# Patient Record
Sex: Female | Born: 1990
Health system: Southern US, Community
[De-identification: ages and names within clinical notes are randomized; demographics above are authoritative.]

## PROBLEM LIST (undated history)

## (undated) DIAGNOSIS — Z8619 Personal history of other infectious and parasitic diseases: Secondary | ICD-10-CM

## (undated) DIAGNOSIS — R87629 Unspecified abnormal cytological findings in specimens from vagina: Secondary | ICD-10-CM

## (undated) DIAGNOSIS — Z872 Personal history of diseases of the skin and subcutaneous tissue: Secondary | ICD-10-CM

## (undated) HISTORY — DX: Personal history of other infectious and parasitic diseases: Z86.19

## (undated) HISTORY — DX: Unspecified abnormal cytological findings in specimens from vagina: R87.629

## (undated) HISTORY — DX: Personal history of diseases of the skin and subcutaneous tissue: Z87.2

## (undated) HISTORY — PX: DILATION AND CURETTAGE OF UTERUS: SHX78

---
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
## 2010-11-24 ENCOUNTER — Other Ambulatory Visit: Payer: Self-pay | Admitting: Family Medicine

## 2010-11-24 ENCOUNTER — Other Ambulatory Visit (HOSPITAL_COMMUNITY)
Admission: RE | Admit: 2010-11-24 | Discharge: 2010-11-24 | Disposition: A | Discharge status: Home / Self Care | Payer: 59 | Source: Ambulatory Visit | Attending: Family Medicine | Admitting: Family Medicine

## 2010-11-24 DIAGNOSIS — Z124 Encounter for screening for malignant neoplasm of cervix: Secondary | ICD-10-CM | POA: Insufficient documentation

## 2011-12-01 ENCOUNTER — Other Ambulatory Visit: Payer: Self-pay

## 2011-12-20 ENCOUNTER — Other Ambulatory Visit (HOSPITAL_COMMUNITY)
Admission: RE | Admit: 2011-12-20 | Discharge: 2011-12-20 | Disposition: A | Discharge status: Home / Self Care | Payer: 59 | Source: Ambulatory Visit | Attending: Family Medicine | Admitting: Family Medicine

## 2011-12-20 ENCOUNTER — Other Ambulatory Visit: Payer: Self-pay | Admitting: Family Medicine

## 2011-12-20 DIAGNOSIS — Z124 Encounter for screening for malignant neoplasm of cervix: Secondary | ICD-10-CM | POA: Insufficient documentation

## 2013-05-16 ENCOUNTER — Other Ambulatory Visit: Payer: Self-pay | Admitting: Family Medicine

## 2013-05-16 ENCOUNTER — Other Ambulatory Visit (HOSPITAL_COMMUNITY)
Admission: RE | Admit: 2013-05-16 | Discharge: 2013-05-16 | Disposition: A | Discharge status: Home / Self Care | Payer: 59 | Source: Ambulatory Visit | Attending: Family Medicine | Admitting: Family Medicine

## 2013-05-16 DIAGNOSIS — Z124 Encounter for screening for malignant neoplasm of cervix: Secondary | ICD-10-CM | POA: Insufficient documentation

## 2013-05-16 DIAGNOSIS — Z1151 Encounter for screening for human papillomavirus (HPV): Secondary | ICD-10-CM | POA: Insufficient documentation

## 2013-05-16 DIAGNOSIS — R87810 Cervical high risk human papillomavirus (HPV) DNA test positive: Secondary | ICD-10-CM | POA: Insufficient documentation

## 2013-07-11 ENCOUNTER — Other Ambulatory Visit: Payer: Self-pay | Admitting: Obstetrics and Gynecology

## 2014-12-09 ENCOUNTER — Other Ambulatory Visit: Payer: Self-pay | Admitting: Obstetrics and Gynecology

## 2014-12-10 LAB — CYTOLOGY - PAP

## 2016-03-03 ENCOUNTER — Other Ambulatory Visit: Payer: Self-pay | Admitting: Obstetrics and Gynecology

## 2016-03-03 DIAGNOSIS — Z3490 Encounter for supervision of normal pregnancy, unspecified, unspecified trimester: Secondary | ICD-10-CM | POA: Insufficient documentation

## 2016-03-03 LAB — OB RESULTS CONSOLE HIV ANTIBODY (ROUTINE TESTING): HIV: NONREACTIVE

## 2016-03-03 LAB — OB RESULTS CONSOLE ABO/RH: RH Type: POSITIVE

## 2016-03-03 LAB — OB RESULTS CONSOLE GC/CHLAMYDIA
CHLAMYDIA, DNA PROBE: NEGATIVE
Gonorrhea: NEGATIVE

## 2016-03-03 LAB — OB RESULTS CONSOLE ANTIBODY SCREEN: Antibody Screen: NEGATIVE

## 2016-03-03 LAB — OB RESULTS CONSOLE HEPATITIS B SURFACE ANTIGEN: HEP B S AG: NEGATIVE

## 2016-03-03 LAB — OB RESULTS CONSOLE RUBELLA ANTIBODY, IGM: RUBELLA: IMMUNE

## 2016-03-03 LAB — OB RESULTS CONSOLE RPR: RPR: NONREACTIVE

## 2016-03-04 LAB — CYTOLOGY - PAP

## 2016-05-10 NOTE — L&D Delivery Note (Signed)
Delivery Note Patient pushed for about an hour after she was noted to be C/C/+2  At 2:57 PM a viable and healthy female was delivered via Vaginal, Spontaneous Delivery (Presentation:OA ).  APGAR: 8, 9; weight pending.  Shoulders and body easily delivered.  Baby placed on maternal abdomen. He was crying vigorously and moving all extremities. Cord double clamped and cut by father.  Cord blood obtained. Placenta spontaneously delivered intact 3 vessels noted.  No complications.    Anesthesia:  Epidural and local lidocaine Episiotomy: None Lacerations: 2nd degree;Vaginal Suture Repair: 2.0 vicryl Est. Blood Loss (mL):  350 mL  Mom to postpartum.  Baby to Couplet care / Skin to Skin.  Essie HartINN, Terie Lear STACIA 10/20/2016, 3:17 PM

## 2016-09-22 ENCOUNTER — Other Ambulatory Visit: Payer: Self-pay | Admitting: Obstetrics and Gynecology

## 2016-09-22 LAB — OB RESULTS CONSOLE GBS: GBS: POSITIVE

## 2016-10-18 ENCOUNTER — Encounter (HOSPITAL_COMMUNITY): Payer: Self-pay | Admitting: *Deleted

## 2016-10-18 ENCOUNTER — Telehealth (HOSPITAL_COMMUNITY): Payer: Self-pay | Admitting: *Deleted

## 2016-10-18 NOTE — Telephone Encounter (Signed)
Preadmission screen  

## 2016-10-19 ENCOUNTER — Inpatient Hospital Stay (HOSPITAL_COMMUNITY)
Admission: AD | Admit: 2016-10-19 | Discharge: 2016-10-22 | DRG: 775 | Disposition: A | Discharge status: Home / Self Care | Payer: 59 | Source: Ambulatory Visit | Attending: Obstetrics & Gynecology | Admitting: Obstetrics & Gynecology

## 2016-10-19 DIAGNOSIS — Z3A40 40 weeks gestation of pregnancy: Secondary | ICD-10-CM | POA: Diagnosis not present

## 2016-10-19 DIAGNOSIS — O480 Post-term pregnancy: Secondary | ICD-10-CM | POA: Diagnosis present

## 2016-10-19 DIAGNOSIS — O99214 Obesity complicating childbirth: Secondary | ICD-10-CM | POA: Diagnosis present

## 2016-10-19 DIAGNOSIS — O3663X0 Maternal care for excessive fetal growth, third trimester, not applicable or unspecified: Secondary | ICD-10-CM | POA: Diagnosis present

## 2016-10-19 DIAGNOSIS — Z3A41 41 weeks gestation of pregnancy: Secondary | ICD-10-CM

## 2016-10-19 DIAGNOSIS — Z6841 Body Mass Index (BMI) 40.0 and over, adult: Secondary | ICD-10-CM

## 2016-10-19 LAB — CBC
HCT: 36.2 % (ref 36.0–46.0)
Hemoglobin: 12.4 g/dL (ref 12.0–15.0)
MCH: 29.9 pg (ref 26.0–34.0)
MCHC: 34.3 g/dL (ref 30.0–36.0)
MCV: 87.2 fL (ref 78.0–100.0)
PLATELETS: 324 10*3/uL (ref 150–400)
RBC: 4.15 MIL/uL (ref 3.87–5.11)
RDW: 13.4 % (ref 11.5–15.5)
WBC: 11.3 10*3/uL — ABNORMAL HIGH (ref 4.0–10.5)

## 2016-10-19 LAB — ABO/RH: ABO/RH(D): A POS

## 2016-10-19 LAB — TYPE AND SCREEN
ABO/RH(D): A POS
Antibody Screen: NEGATIVE

## 2016-10-19 MED ORDER — DIPHENHYDRAMINE HCL 50 MG/ML IJ SOLN: 12.5000 mg | INTRAMUSCULAR | Status: DC | PRN

## 2016-10-19 MED ORDER — LACTATED RINGERS IV SOLN
500.0000 mL | INTRAVENOUS | Status: DC | PRN
  Administered 2016-10-20: 500 mL via INTRAVENOUS
  Administered 2016-10-20 (×2): 1000 mL via INTRAVENOUS

## 2016-10-19 MED ORDER — FENTANYL 2.5 MCG/ML BUPIVACAINE 1/10 % EPIDURAL INFUSION (WH - ANES)
14.0000 mL/h | INTRAMUSCULAR | Status: DC | PRN
  Administered 2016-10-20 (×2): 14 mL/h via EPIDURAL
  Filled 2016-10-19 (×2): qty 100

## 2016-10-19 MED ORDER — EPHEDRINE 5 MG/ML INJ
10.0000 mg | INTRAVENOUS | Status: DC | PRN
  Filled 2016-10-19: qty 2

## 2016-10-19 MED ORDER — LIDOCAINE HCL (PF) 1 % IJ SOLN
30.0000 mL | INTRAMUSCULAR | Status: AC | PRN
  Administered 2016-10-20: 30 mL via SUBCUTANEOUS
  Filled 2016-10-19: qty 30

## 2016-10-19 MED ORDER — TERBUTALINE SULFATE 1 MG/ML IJ SOLN
0.2500 mg | Freq: Once | INTRAMUSCULAR | Status: DC | PRN
  Filled 2016-10-19: qty 1

## 2016-10-19 MED ORDER — LACTATED RINGERS IV SOLN
500.0000 mL | Freq: Once | INTRAVENOUS | Status: AC
  Administered 2016-10-20: 500 mL via INTRAVENOUS

## 2016-10-19 MED ORDER — MISOPROSTOL 25 MCG QUARTER TABLET
25.0000 ug | ORAL_TABLET | ORAL | Status: DC | PRN
  Administered 2016-10-19: 25 ug via VAGINAL
  Filled 2016-10-19 (×2): qty 1

## 2016-10-19 MED ORDER — OXYCODONE-ACETAMINOPHEN 5-325 MG PO TABS: 2.0000 | ORAL_TABLET | ORAL | Status: DC | PRN

## 2016-10-19 MED ORDER — OXYTOCIN 40 UNITS IN LACTATED RINGERS INFUSION - SIMPLE MED
1.0000 m[IU]/min | INTRAVENOUS | Status: DC
  Administered 2016-10-19: 2 m[IU]/min via INTRAVENOUS
  Filled 2016-10-19: qty 1000

## 2016-10-19 MED ORDER — PENICILLIN G POT IN DEXTROSE 60000 UNIT/ML IV SOLN
3.0000 10*6.[IU] | INTRAVENOUS | Status: DC
  Filled 2016-10-19: qty 50

## 2016-10-19 MED ORDER — PHENYLEPHRINE 40 MCG/ML (10ML) SYRINGE FOR IV PUSH (FOR BLOOD PRESSURE SUPPORT)
80.0000 ug | PREFILLED_SYRINGE | INTRAVENOUS | Status: DC | PRN
  Filled 2016-10-19: qty 10
  Filled 2016-10-19: qty 5

## 2016-10-19 MED ORDER — PHENYLEPHRINE 40 MCG/ML (10ML) SYRINGE FOR IV PUSH (FOR BLOOD PRESSURE SUPPORT)
80.0000 ug | PREFILLED_SYRINGE | INTRAVENOUS | Status: DC | PRN
  Administered 2016-10-20: 80 ug via INTRAVENOUS
  Filled 2016-10-19: qty 5

## 2016-10-19 MED ORDER — SOD CITRATE-CITRIC ACID 500-334 MG/5ML PO SOLN
30.0000 mL | ORAL | Status: DC | PRN
  Filled 2016-10-19: qty 15

## 2016-10-19 MED ORDER — OXYTOCIN 40 UNITS IN LACTATED RINGERS INFUSION - SIMPLE MED: 2.5000 [IU]/h | INTRAVENOUS | Status: DC

## 2016-10-19 MED ORDER — FENTANYL CITRATE (PF) 100 MCG/2ML IJ SOLN: 50.0000 ug | INTRAMUSCULAR | Status: DC | PRN

## 2016-10-19 MED ORDER — OXYTOCIN BOLUS FROM INFUSION
500.0000 mL | Freq: Once | INTRAVENOUS | Status: AC
  Administered 2016-10-20: 500 mL via INTRAVENOUS

## 2016-10-19 MED ORDER — PENICILLIN G POTASSIUM 5000000 UNITS IJ SOLR
5.0000 10*6.[IU] | Freq: Once | INTRAVENOUS | Status: AC
  Administered 2016-10-19: 5 10*6.[IU] via INTRAVENOUS
  Filled 2016-10-19: qty 5

## 2016-10-19 MED ORDER — ONDANSETRON HCL 4 MG/2ML IJ SOLN: 4.0000 mg | Freq: Four times a day (QID) | INTRAMUSCULAR | Status: DC | PRN

## 2016-10-19 MED ORDER — TERBUTALINE SULFATE 1 MG/ML IJ SOLN
0.2500 mg | Freq: Once | INTRAMUSCULAR | Status: AC | PRN
  Administered 2016-10-20: 0.25 mg via SUBCUTANEOUS

## 2016-10-19 MED ORDER — PENICILLIN G POTASSIUM 5000000 UNITS IJ SOLR
5.0000 10*6.[IU] | Freq: Once | INTRAVENOUS | Status: DC
  Filled 2016-10-19: qty 5

## 2016-10-19 MED ORDER — OXYCODONE-ACETAMINOPHEN 5-325 MG PO TABS: 1.0000 | ORAL_TABLET | ORAL | Status: DC | PRN

## 2016-10-19 MED ORDER — LACTATED RINGERS IV SOLN
INTRAVENOUS | Status: DC
  Administered 2016-10-19 – 2016-10-20 (×2): via INTRAVENOUS

## 2016-10-19 MED ORDER — PENICILLIN G POT IN DEXTROSE 60000 UNIT/ML IV SOLN
3.0000 10*6.[IU] | INTRAVENOUS | Status: DC
  Administered 2016-10-20 (×2): 3 10*6.[IU] via INTRAVENOUS
  Filled 2016-10-19 (×9): qty 50

## 2016-10-19 MED ORDER — ACETAMINOPHEN 325 MG PO TABS: 650.0000 mg | ORAL_TABLET | ORAL | Status: DC | PRN

## 2016-10-20 ENCOUNTER — Inpatient Hospital Stay (HOSPITAL_COMMUNITY): Payer: 59 | Admitting: Anesthesiology

## 2016-10-20 ENCOUNTER — Encounter (HOSPITAL_COMMUNITY): Payer: Self-pay | Admitting: *Deleted

## 2016-10-20 LAB — RPR: RPR Ser Ql: NONREACTIVE

## 2016-10-20 MED ORDER — OXYCODONE-ACETAMINOPHEN 5-325 MG PO TABS: 1.0000 | ORAL_TABLET | ORAL | Status: DC | PRN

## 2016-10-20 MED ORDER — TETANUS-DIPHTH-ACELL PERTUSSIS 5-2.5-18.5 LF-MCG/0.5 IM SUSP: 0.5000 mL | Freq: Once | INTRAMUSCULAR | Status: DC

## 2016-10-20 MED ORDER — ONDANSETRON HCL 4 MG/2ML IJ SOLN: 4.0000 mg | INTRAMUSCULAR | Status: DC | PRN

## 2016-10-20 MED ORDER — ONDANSETRON HCL 4 MG PO TABS: 4.0000 mg | ORAL_TABLET | ORAL | Status: DC | PRN

## 2016-10-20 MED ORDER — PRENATAL MULTIVITAMIN CH
1.0000 | ORAL_TABLET | Freq: Every day | ORAL | Status: DC
  Administered 2016-10-21 – 2016-10-22 (×2): 1 via ORAL
  Filled 2016-10-20 (×2): qty 1

## 2016-10-20 MED ORDER — ACETAMINOPHEN 325 MG PO TABS
650.0000 mg | ORAL_TABLET | ORAL | Status: DC | PRN
Start: 2016-10-20 — End: 2016-10-22

## 2016-10-20 MED ORDER — SIMETHICONE 80 MG PO CHEW: 80.0000 mg | CHEWABLE_TABLET | ORAL | Status: DC | PRN

## 2016-10-20 MED ORDER — DIPHENHYDRAMINE HCL 25 MG PO CAPS: 25.0000 mg | ORAL_CAPSULE | Freq: Four times a day (QID) | ORAL | Status: DC | PRN

## 2016-10-20 MED ORDER — OXYTOCIN 40 UNITS IN LACTATED RINGERS INFUSION - SIMPLE MED: 2.5000 [IU]/h | INTRAVENOUS | Status: DC | PRN

## 2016-10-20 MED ORDER — LIDOCAINE HCL (PF) 1 % IJ SOLN
INTRAMUSCULAR | Status: DC | PRN
  Administered 2016-10-20: 5 mL
  Administered 2016-10-20: 4 mL
  Administered 2016-10-20: 6 mL via EPIDURAL
  Administered 2016-10-20: 5 mL

## 2016-10-20 MED ORDER — BENZOCAINE-MENTHOL 20-0.5 % EX AERO
1.0000 "application " | INHALATION_SPRAY | CUTANEOUS | Status: DC | PRN
  Administered 2016-10-20: 1 via TOPICAL
  Filled 2016-10-20: qty 56

## 2016-10-20 MED ORDER — ZOLPIDEM TARTRATE 5 MG PO TABS: 5.0000 mg | ORAL_TABLET | Freq: Every evening | ORAL | Status: DC | PRN

## 2016-10-20 MED ORDER — DIBUCAINE 1 % RE OINT: 1.0000 "application " | TOPICAL_OINTMENT | RECTAL | Status: DC | PRN

## 2016-10-20 MED ORDER — SODIUM BICARBONATE 8.4 % IV SOLN
INTRAVENOUS | Status: DC | PRN
  Administered 2016-10-20 (×3): 5 mL via EPIDURAL

## 2016-10-20 MED ORDER — SENNOSIDES-DOCUSATE SODIUM 8.6-50 MG PO TABS
2.0000 | ORAL_TABLET | ORAL | Status: DC
  Administered 2016-10-21: 2 via ORAL
  Filled 2016-10-20 (×2): qty 2

## 2016-10-20 MED ORDER — OXYCODONE-ACETAMINOPHEN 5-325 MG PO TABS: 2.0000 | ORAL_TABLET | ORAL | Status: DC | PRN

## 2016-10-20 MED ORDER — COCONUT OIL OIL
1.0000 | TOPICAL_OIL | Status: DC | PRN
Start: 2016-10-20 — End: 2016-10-22

## 2016-10-20 MED ORDER — WITCH HAZEL-GLYCERIN EX PADS: 1.0000 "application " | MEDICATED_PAD | CUTANEOUS | Status: DC | PRN

## 2016-10-20 MED ORDER — IBUPROFEN 600 MG PO TABS
600.0000 mg | ORAL_TABLET | Freq: Four times a day (QID) | ORAL | Status: DC
  Administered 2016-10-20 – 2016-10-22 (×8): 600 mg via ORAL
  Filled 2016-10-20 (×8): qty 1

## 2016-10-20 NOTE — Anesthesia Preprocedure Evaluation (Signed)
Anesthesia Evaluation  Patient identified by MRN, date of birth, ID band Patient awake    Reviewed: Allergy & Precautions, H&P , Patient's Chart, lab work & pertinent test results  Airway Mallampati: II  TM Distance: >3 FB Neck ROM: full    Dental  (+) Teeth Intact   Pulmonary    breath sounds clear to auscultation       Cardiovascular  Rhythm:regular Rate:Normal     Neuro/Psych    GI/Hepatic   Endo/Other  Morbid obesity  Renal/GU      Musculoskeletal   Abdominal   Peds  Hematology   Anesthesia Other Findings       Reproductive/Obstetrics (+) Pregnancy                             Anesthesia Physical Anesthesia Plan  ASA: II  Anesthesia Plan: Epidural   Post-op Pain Management:    Induction:   PONV Risk Score and Plan:   Airway Management Planned:   Additional Equipment:   Intra-op Plan:   Post-operative Plan:   Informed Consent: I have reviewed the patients History and Physical, chart, labs and discussed the procedure including the risks, benefits and alternatives for the proposed anesthesia with the patient or authorized representative who has indicated his/her understanding and acceptance.   Dental Advisory Given  Plan Discussed with:   Anesthesia Plan Comments: (Labs checked- platelets confirmed with RN in room. Fetal heart tracing, per RN, reported to be stable enough for sitting procedure. Discussed epidural, and patient consents to the procedure:  included risk of possible headache,backache, failed block, allergic reaction, and nerve injury. This patient was asked if she had any questions or concerns before the procedure started.)        Anesthesia Quick Evaluation

## 2016-10-20 NOTE — Progress Notes (Signed)
Lacey Sloan is a 26 y.o. G2P0010 at 4573w4d by LMP admitted for induction of labor due to Non-reactive NST.  Subjective: Patient still in pain despite her epidural, mostly on her left side  Objective: BP 126/72   Pulse 83   Temp 98 F (36.7 C) (Oral)   Resp 18   Ht 5\' 6"  (1.676 m)   Wt (!) 142.9 kg (315 lb)   SpO2 98%   BMI 50.84 kg/m  No intake/output data recorded. Total I/O In: -  Out: 800 [Urine:800]  FHT:  FHR: 110 bpm, variability: moderate,  accelerations:  Present,  decelerations:  Absent UC:   regular, every 4-5 minutes SVE:   Dilation: 4 Effacement (%): 70 Station: -3, -2 Exam by:: Dr Mora ApplPinn  Labs: Lab Results  Component Value Date   WBC 11.3 (H) 10/19/2016   HGB 12.4 10/19/2016   HCT 36.2 10/19/2016   MCV 87.2 10/19/2016   PLT 324 10/19/2016    Assessment / Plan: Induction of labor due to non-reassuring fetal testing,  progressing well on pitocin IUPC and FSE placed  Labor: Progressing normally Preeclampsia:  no signs or symptoms of toxicity Fetal Wellbeing:  Category I Pain Control:  Epidural and will ask Anesthesia to evaluate I/D:  n/a Anticipated MOD:  Lacey Sloan  Lacey Sloan Lacey Sloan 10/20/2016, 9:36 AM

## 2016-10-20 NOTE — Progress Notes (Signed)
Cx now 80/4/-2, AROM with clear fluid. Will continue to increase pit

## 2016-10-20 NOTE — Progress Notes (Signed)
Pt states "there is no difference in pain" after previous epidural procedures.   Anesthesia updated.

## 2016-10-20 NOTE — Anesthesia Pain Management Evaluation Note (Signed)
  CRNA Pain Management Visit Note  Patient: Lacey Sloan, 26 y.o., female  "Hello I am a member of the anesthesia team at [facility]. We have an anesthesia team available at all times to provide care throughout the hospital, including epidural management and anesthesia for C-section. I don't know your plan for the delivery whether it a natural birth, water birth, IV sedation, nitrous supplementation, doula or epidural, but we want to meet your pain goals."   1.Was your pain managed to your expectations on prior hospitalizations?   Yes   2.What is your expectation for pain management during this hospitalization?     Epidural  3.How can we help you reach that goal? Pt currently has epidural but is feeling lots of pressure. Encouraged her to hit PCE button with her nurse present.  Record the patient's initial score and the patient's pain goal.   Pain: 7  Pain Goal: 0 [facility] wants you to be able to say your pain was always managed very well.  Bolton Canupp 10/20/2016

## 2016-10-20 NOTE — Anesthesia Procedure Notes (Signed)
Epidural Patient location during procedure: OB  Staffing Anesthesiologist: Phillips GroutARIGNAN, Suriah Peragine Performed: anesthesiologist   Preanesthetic Checklist Completed: patient identified, site marked, surgical consent, pre-op evaluation, timeout performed, IV checked, risks and benefits discussed and monitors and equipment checked  Epidural Patient position: sitting Prep: DuraPrep Patient monitoring: heart rate, continuous pulse ox and blood pressure Approach: midline Location: L2-L3 Injection technique: LOR saline  Needle:  Needle type: Tuohy  Needle gauge: 17 G Needle length: 9 cm and 9 Needle insertion depth: 9 cm Catheter type: closed end flexible Catheter size: 20 Guage Catheter at skin depth: 13 cm Test dose: negative  Assessment Events: blood not aspirated, injection not painful, no injection resistance, negative IV test and no paresthesia  Additional Notes Patient identified. Risks/Benefits/Options discussed with patient including but not limited to bleeding, infection, nerve damage, paralysis, failed block, incomplete pain control, headache, blood pressure changes, nausea, vomiting, reactions to medication both or allergic, itching and postpartum back pain. Confirmed with bedside nurse the patient's most recent platelet count. Confirmed with patient that they are not currently taking any anticoagulation, have any bleeding history or any family history of bleeding disorders. Patient expressed understanding and wished to proceed. All questions were answered. Sterile technique was used throughout the entire procedure. Please see nursing notes for vital signs. Test dose was given through epidural needle and negative prior to continuing to dose epidural or start infusion. Warning signs of high block given to the patient including shortness of breath, tingling/numbness in hands, complete motor block, or any concerning symptoms with instructions to call for help. Patient was given instructions  on fall risk and not to get out of bed. All questions and concerns addressed with instructions to call with any issues.

## 2016-10-20 NOTE — Discharge Summary (Signed)
Obstetric Discharge Summary Reason for Admission: induction of labor Prenatal Procedures: NST and ultrasound Intrapartum Procedures: spontaneous vaginal delivery Postpartum Procedures: none Complications-Operative and Postpartum: 2 degree perineal laceration Hemoglobin  Date Value Ref Range Status  10/19/2016 12.4 12.0 - 15.0 g/dL Final   HCT  Date Value Ref Range Status  10/19/2016 36.2 36.0 - 46.0 % Final     Discharge Diagnoses: Term Pregnancy-delivered  Discharge Information: Date: 10/20/2016 Activity: pelvic rest Diet: routine Medications: Ibuprofen Condition: stable Instructions: refer to practice specific booklet Discharge to: home Follow-up Information    Essie HartPinn, Walda, MD Follow up in 4 week(s).   Specialty:  Obstetrics and Gynecology Contact information: [address] [place] [address] [phone]           Newborn Data: Live born female  Birth Weight: 9 lb 0.6 oz (4100 g) APGAR: 8, 9  Home with mother.  Lacey Sloan A 10/20/2016, 5:51 PM

## 2016-10-20 NOTE — Anesthesia Procedure Notes (Signed)
Epidural Patient location during procedure: OB  Staffing Anesthesiologist: Cristela BlueJACKSON, Eliyohu Class  Preanesthetic Checklist Completed: patient identified, site marked, surgical consent, pre-op evaluation, timeout performed, IV checked, risks and benefits discussed and monitors and equipment checked  Epidural Patient position: sitting Prep: site prepped and draped and DuraPrep Patient monitoring: continuous pulse ox and blood pressure Approach: midline Location: L3-L4 Injection technique: LOR air  Needle:  Needle type: Tuohy  Needle gauge: 17 G Needle length: 9 cm and 9 Needle insertion depth: 8 cm Catheter type: closed end flexible Catheter size: 19 Gauge Catheter at skin depth: 18 cm Test dose: negative  Assessment Events: blood not aspirated, injection not painful, no injection resistance, negative IV test and no paresthesia  Additional Notes Dosing of Epidural:  1st dose, through catheter ............................................Marland Kitchen.  Xylocaine 40 mg  2nd dose, through catheter, after waiting 3 minutes........Marland Kitchen.Xylocaine 60 mg    As each dose occurred, patient was free of IV sx; and patient exhibited no evidence of SA injection.  Patient is more comfortable after epidural dosed. Please see RN's note for documentation of vital signs,and FHR which are stable.  Patient reminded not to try to ambulate with numb legs, and that an RN must be present when she attempts to get up.

## 2016-10-20 NOTE — Anesthesia Postprocedure Evaluation (Signed)
Anesthesia Post Note  Patient: Lacey Sloan  Procedure(s) Performed: * No procedures listed *     Patient location during evaluation: Mother Baby Anesthesia Type: Epidural Level of consciousness: awake and alert and oriented Pain management: pain level controlled Vital Signs Assessment: post-procedure vital signs reviewed and stable Respiratory status: spontaneous breathing and nonlabored ventilation Cardiovascular status: stable Postop Assessment: no headache, patient able to bend at knees, no backache, no signs of nausea or vomiting, epidural receding and adequate PO intake Anesthetic complications: no    Last Vitals:  Vitals:   10/20/16 1644 10/20/16 1756  BP: (!) 141/80 122/77  Pulse: 84 86  Resp: 18 18  Temp: 37.8 C 36.3 C    Last Pain:  Vitals:   10/20/16 1756  TempSrc: Oral  PainSc:    Pain Goal:                 Land O'LakesMalinova,Jerrye Seebeck Hristova

## 2016-10-20 NOTE — Progress Notes (Signed)
Baby had a prolonged decel after epidural placement around 3 am. This was txd with position changes/terb/ephedrine. NST returned to reactive and pit aug was restarted. Head still high and ballotable

## 2016-10-20 NOTE — H&P (Signed)
Pt is a 26 y/o white female, G2P0010 at 40 3/7 wks who was seen in the office today for a routine PN visit. She was having a NST because she was post term. On the NST there were two decels noted.She was sent over for induction. She had an uncomplicated PNC. She also had an u/s today which gave an EFW of 4450 grams. PMHX: See hollister PE: VSSAF         HEENT wnl         ABD-gravid, non tender, obese         Cx-30/2/ballot         FHTs-reactive IMP/ IUP at 40 3/7 weeks with a decel on NST,         Post term         LGA Plan/ Start induction

## 2016-10-21 LAB — CBC
HEMATOCRIT: 33 % — AB (ref 36.0–46.0)
HEMOGLOBIN: 11.3 g/dL — AB (ref 12.0–15.0)
MCH: 30.3 pg (ref 26.0–34.0)
MCHC: 34.2 g/dL (ref 30.0–36.0)
MCV: 88.5 fL (ref 78.0–100.0)
Platelets: 222 10*3/uL (ref 150–400)
RBC: 3.73 MIL/uL — AB (ref 3.87–5.11)
RDW: 13.7 % (ref 11.5–15.5)
WBC: 16.1 10*3/uL — AB (ref 4.0–10.5)

## 2016-10-21 NOTE — Progress Notes (Signed)
Baby bleeding from scalp electrode site overnight.  Will not do circ until full evaluation done for baby.  Peds to see baby today.

## 2016-10-21 NOTE — Progress Notes (Signed)
Patient is eating, ambulating, voiding.  Pain control is good.  Vitals:   10/20/16 1644 10/20/16 1756 10/20/16 2225 10/21/16 0518  BP: (!) 141/80 122/77 (!) 104/51 (!) 114/56  Pulse: 84 86 86 77  Resp: 18 18 17 18   Temp: 100 F (37.8 C) 97.4 F (36.3 C) 97.8 F (36.6 C) 97.7 F (36.5 C)  TempSrc: Oral Oral Oral Oral  SpO2:  99% 97%   Weight:      Height:        Fundus firm Perineum without swelling.  Lab Results  Component Value Date   WBC 16.1 (H) 10/21/2016   HGB 11.3 (L) 10/21/2016   HCT 33.0 (L) 10/21/2016   MCV 88.5 10/21/2016   PLT 222 10/21/2016    --/--/A POS, A POS (06/12 1942)/RI  A/P Post partum day 1.  Routine care.  Expect d/c routine.    Nilza Eaker A

## 2016-10-22 NOTE — Discharge Summary (Signed)
No change from Dr. Kittie PlaterHorvath's d/c summary.  Pt to f/u in office in 4 weeks with Dr. Mora ApplPinn.  Pt to call office to schedule circumcision, was unable to be performed yesterday.  Pt prefers to go home and schedule outpt rather than wait until later today.

## 2016-10-25 ENCOUNTER — Inpatient Hospital Stay (HOSPITAL_COMMUNITY): Admission: RE | Admit: 2016-10-25 | Payer: 59 | Source: Ambulatory Visit

## 2018-02-15 ENCOUNTER — Encounter (HOSPITAL_COMMUNITY): Payer: Self-pay

## 2018-02-15 ENCOUNTER — Emergency Department (HOSPITAL_COMMUNITY): Payer: 59

## 2018-02-15 ENCOUNTER — Emergency Department (HOSPITAL_COMMUNITY)
Admission: EM | Admit: 2018-02-15 | Discharge: 2018-02-15 | Disposition: A | Discharge status: Home / Self Care | Payer: 59 | Attending: Emergency Medicine | Admitting: Emergency Medicine

## 2018-02-15 ENCOUNTER — Other Ambulatory Visit: Payer: Self-pay

## 2018-02-15 DIAGNOSIS — F17200 Nicotine dependence, unspecified, uncomplicated: Secondary | ICD-10-CM | POA: Insufficient documentation

## 2018-02-15 DIAGNOSIS — M549 Dorsalgia, unspecified: Secondary | ICD-10-CM

## 2018-02-15 DIAGNOSIS — M79671 Pain in right foot: Secondary | ICD-10-CM | POA: Insufficient documentation

## 2018-02-15 DIAGNOSIS — M5489 Other dorsalgia: Secondary | ICD-10-CM | POA: Insufficient documentation

## 2018-02-15 MED ORDER — LIDOCAINE 5 % EX PTCH: 1.0000 | MEDICATED_PATCH | CUTANEOUS | 0 refills | Status: DC

## 2018-02-15 MED ORDER — METHOCARBAMOL 500 MG PO TABS: 500.0000 mg | ORAL_TABLET | Freq: Two times a day (BID) | ORAL | 0 refills | Status: DC

## 2018-02-15 NOTE — ED Notes (Signed)
Bed: WTR9 Expected date:  Expected time:  Means of arrival:  Comments: 

## 2018-02-15 NOTE — Discharge Instructions (Addendum)
Expect your soreness to increase over the next 2-3 days. Take it easy, but do not lay around too much as this may make any stiffness worse.  Antiinflammatory medications: Take 600 mg of ibuprofen every 6 hours or 440 mg (over the counter dose) to 500 mg (prescription dose) of naproxen every 12 hours for the next 3 days. After this time, these medications may be used as needed for pain. Take these medications with food to avoid upset stomach. Choose only one of these medications, do not take them together. Acetaminophen (generic for Tylenol): Should you continue to have additional pain while taking the ibuprofen or naproxen, you may add in acetaminophen as needed. Your daily total maximum amount of acetaminophen from all sources should be limited to 4000mg /day for persons without liver problems, or 2000mg /day for those with liver problems. Muscle relaxer: Robaxin is a muscle relaxer and may help loosen stiff muscles. Do not take the Robaxin while driving or performing other dangerous activities.  Lidocaine patches: These are available via either prescription or over-the-counter. The over-the-counter option may be more economical one and are likely just as effective. There are multiple over-the-counter brands, such as Salonpas. Exercises: Be sure to perform the attached exercises starting with three times a week and working up to performing them daily. This is an essential part of preventing long term problems.  Follow up: Follow up with a primary care provider for any future management of these complaints. Be sure to follow up within 7-10 days. Return: Return to the ED should symptoms worsen.  For prescription assistance, may try using prescription discount sites or apps, such as goodrx.com  You have been seen today for a foot injury. There were no acute abnormalities on the x-rays, including no sign of fracture or dislocation, however, there could be injuries to the soft tissues, such as the ligaments or  tendons that are not seen on xrays. There could also be what are called occult fractures that are small fractures not seen on xray. Antiinflammatory medications: Take 600 mg of ibuprofen every 6 hours or 440 mg (over the counter dose) to 500 mg (prescription dose) of naproxen every 12 hours for the next 3 days. After this time, these medications may be used as needed for pain. Take these medications with food to avoid upset stomach. Choose only one of these medications, do not take them together. Acetaminophen (generic for Tylenol): Should you continue to have additional pain while taking the ibuprofen or naproxen, you may add in acetaminophen as needed. Your daily total maximum amount of acetaminophen from all sources should be limited to 4000mg /day for persons without liver problems, or 2000mg /day for those with liver problems. Ice: May apply ice to the area over the next 24 hours for 15 minutes at a time to reduce swelling. Elevation: Keep the extremity elevated as often as possible to reduce pain and inflammation. Support: Wear the cam boot for support and comfort. Wear this until pain resolves. You will be weight-bearing as tolerated, which means you can slowly start to put weight on the extremity and increase amount and frequency as pain allows. Exercises: Start by performing these exercises a few times a week, increasing the frequency until you are performing them twice daily.  Follow up: If symptoms are improving, you may follow up with your primary care provider for any continued management. If symptoms are not starting to improve within a week, you should follow up with the orthopedic specialist within two weeks. Return: Return to the  ED for numbness, weakness, increasing pain, overall worsening symptoms, loss of function, or if symptoms are not improving, you have tried to follow up with the orthopedic specialist, and have been unable to do so.  For prescription assistance, may try using  prescription discount sites or apps, such as goodrx.com
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
## 2018-10-10 ENCOUNTER — Telehealth: Payer: Self-pay | Admitting: Internal Medicine

## 2018-10-10 NOTE — Telephone Encounter (Signed)
Yes, I will accept her 

## 2018-10-10 NOTE — Telephone Encounter (Signed)
Scheduled

## 2018-10-10 NOTE — Telephone Encounter (Signed)
Copied from CRM [id number]. Topic: Appointment Scheduling - Scheduling Inquiry for Clinic >> Oct 09, 2018  4:59 PM Angela Nevin wrote: Reason for CRM: Patient would like to establish care with Dr. Lawerance Bach. Advised her that it would need to be approved. Patient has family members who also see Dr. Lawerance Bach. Please contact patient if able to establish.

## 2018-10-16 DIAGNOSIS — R87612 Low grade squamous intraepithelial lesion on cytologic smear of cervix (LGSIL): Secondary | ICD-10-CM | POA: Insufficient documentation
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
## 2018-10-17 ENCOUNTER — Other Ambulatory Visit: Payer: Self-pay

## 2018-10-17 ENCOUNTER — Other Ambulatory Visit (INDEPENDENT_AMBULATORY_CARE_PROVIDER_SITE_OTHER): Payer: 59

## 2018-10-17 ENCOUNTER — Encounter: Payer: Self-pay | Admitting: Internal Medicine

## 2018-10-17 ENCOUNTER — Ambulatory Visit (INDEPENDENT_AMBULATORY_CARE_PROVIDER_SITE_OTHER): Payer: 59 | Admitting: Internal Medicine

## 2018-10-17 VITALS — BP 112/68 | HR 82 | Temp 98.2°F | Resp 18 | Ht 65.0 in | Wt 250.8 lb

## 2018-10-17 DIAGNOSIS — Z0000 Encounter for general adult medical examination without abnormal findings: Secondary | ICD-10-CM

## 2018-10-17 DIAGNOSIS — F419 Anxiety disorder, unspecified: Secondary | ICD-10-CM | POA: Diagnosis not present

## 2018-10-17 DIAGNOSIS — F329 Major depressive disorder, single episode, unspecified: Secondary | ICD-10-CM | POA: Diagnosis not present

## 2018-10-17 DIAGNOSIS — B029 Zoster without complications: Secondary | ICD-10-CM | POA: Diagnosis not present

## 2018-10-17 LAB — CBC WITH DIFFERENTIAL/PLATELET
Basophils Absolute: 0.1 10*3/uL (ref 0.0–0.1)
Basophils Relative: 0.9 % (ref 0.0–3.0)
Eosinophils Absolute: 0.2 10*3/uL (ref 0.0–0.7)
Eosinophils Relative: 1.5 % (ref 0.0–5.0)
HCT: 44.5 % (ref 36.0–46.0)
Hemoglobin: 15 g/dL (ref 12.0–15.0)
Lymphocytes Relative: 34.1 % (ref 12.0–46.0)
Lymphs Abs: 3.8 10*3/uL (ref 0.7–4.0)
MCHC: 33.7 g/dL (ref 30.0–36.0)
MCV: 92.1 fl (ref 78.0–100.0)
Monocytes Absolute: 0.5 10*3/uL (ref 0.1–1.0)
Monocytes Relative: 4.5 % (ref 3.0–12.0)
Neutro Abs: 6.5 10*3/uL (ref 1.4–7.7)
Neutrophils Relative %: 59 % (ref 43.0–77.0)
Platelets: 329 10*3/uL (ref 150.0–400.0)
RBC: 4.83 Mil/uL (ref 3.87–5.11)
RDW: 12.5 % (ref 11.5–15.5)
WBC: 11.1 10*3/uL — ABNORMAL HIGH (ref 4.0–10.5)

## 2018-10-17 MED ORDER — SERTRALINE HCL 50 MG PO TABS: 50.0000 mg | ORAL_TABLET | Freq: Every day | ORAL | 5 refills | Status: DC

## 2018-10-17 NOTE — Assessment & Plan Note (Signed)
She is a combination of anxiety and depression She has done therapy in the past and does not feel that that will help at this time Discussed medication options and she would like to try something Start sertraline 50 mg nightly Discussed possible side effects She will call with any questions concerns Follow-up in the office in 4 weeks She will discuss medication with her gynecologist since she is thinking about getting pregnant over the next year or so

## 2018-10-17 NOTE — Patient Instructions (Addendum)
Tests ordered today. Your results will be released to [facility] (or called to you) after review, usually within 72hours after test completion. If any changes need to be made, you will be notified at that same time.  All other Health Maintenance issues reviewed.   All recommended immunizations and age-appropriate screenings are up-to-date or discussed.  No immunizations administered today.   Medications reviewed and updated.  Changes include :   Sertraline 50 mg at bedtime  Your prescription(s) have been submitted to your pharmacy. Please take as directed and contact our office if you believe you are having problem(s) with the medication(s).   Please followup in 4 weeks   Health Maintenance, Female Adopting a healthy lifestyle and getting preventive care can go a long way to promote health and wellness. Talk with your health care provider about what schedule of regular examinations is right for you. This is a good chance for you to check in with your provider about disease prevention and staying healthy. In between checkups, there are plenty of things you can do on your own. Experts have done a lot of research about which lifestyle changes and preventive measures are most likely to keep you healthy. Ask your health care provider for more information. Weight and diet Eat a healthy diet  Be sure to include plenty of vegetables, fruits, low-fat dairy products, and lean protein.  Do not eat a lot of foods high in solid fats, added sugars, or salt.  Get regular exercise. This is one of the most important things you can do for your health. ? Most adults should exercise for at least 150 minutes each week. The exercise should increase your heart rate and make you sweat (moderate-intensity exercise). ? Most adults should also do strengthening exercises at least twice a week. This is in addition to the moderate-intensity exercise. Maintain a healthy weight  Body mass index (BMI) is a measurement  that can be used to identify possible weight problems. It estimates body fat based on height and weight. Your health care provider can help determine your BMI and help you achieve or maintain a healthy weight.  For females 4 years of age and older: ? A BMI below 18.5 is considered underweight. ? A BMI of 18.5 to 24.9 is normal. ? A BMI of 25 to 29.9 is considered overweight. ? A BMI of 30 and above is considered obese. Watch levels of cholesterol and blood lipids  You should start having your blood tested for lipids and cholesterol at 30 years of age, then have this test every 5 years.  You may need to have your cholesterol levels checked more often if: ? Your lipid or cholesterol levels are high. ? You are older than 30 years of age. ? You are at high risk for heart disease. Cancer screening Lung Cancer  Lung cancer screening is recommended for adults 68-55 years old who are at high risk for lung cancer because of a history of smoking.  A yearly low-dose CT scan of the lungs is recommended for people who: ? Currently smoke. ? Have quit within the past 15 years. ? Have at least a 30-pack-year history of smoking. A pack year is smoking an average of one pack of cigarettes a day for 1 year.  Yearly screening should continue until it has been 15 years since you quit.  Yearly screening should stop if you develop a health problem that would prevent you from having lung cancer treatment. Breast Cancer  Practice breast self-awareness.  This means understanding how your breasts normally appear and feel.  It also means doing regular breast self-exams. Let your health care provider know about any changes, no matter how small.  If you are in your 20s or 30s, you should have a clinical breast exam (CBE) by a health care provider every 1-3 years as part of a regular health exam.  If you are 8 or older, have a CBE every year. Also consider having a breast X-ray (mammogram) every year.  If  you have a family history of breast cancer, talk to your health care provider about genetic screening.  If you are at high risk for breast cancer, talk to your health care provider about having an MRI and a mammogram every year.  Breast cancer gene (BRCA) assessment is recommended for women who have family members with BRCA-related cancers. BRCA-related cancers include: ? Breast. ? Ovarian. ? Tubal. ? Peritoneal cancers.  Results of the assessment will determine the need for genetic counseling and BRCA1 and BRCA2 testing. Cervical Cancer Your health care provider may recommend that you be screened regularly for cancer of the pelvic organs (ovaries, uterus, and vagina). This screening involves a pelvic examination, including checking for microscopic changes to the surface of your cervix (Pap test). You may be encouraged to have this screening done every 3 years, beginning at age 74.  For women ages 47-65, health care providers may recommend pelvic exams and Pap testing every 3 years, or they may recommend the Pap and pelvic exam, combined with testing for human papilloma virus (HPV), every 5 years. Some types of HPV increase your risk of cervical cancer. Testing for HPV may also be done on women of any age with unclear Pap test results.  Other health care providers may not recommend any screening for nonpregnant women who are considered low risk for pelvic cancer and who do not have symptoms. Ask your health care provider if a screening pelvic exam is right for you.  If you have had past treatment for cervical cancer or a condition that could lead to cancer, you need Pap tests and screening for cancer for at least 20 years after your treatment. If Pap tests have been discontinued, your risk factors (such as having a new sexual partner) need to be reassessed to determine if screening should resume. Some women have medical problems that increase the chance of getting cervical cancer. In these cases,  your health care provider may recommend more frequent screening and Pap tests. Colorectal Cancer  This type of cancer can be detected and often prevented.  Routine colorectal cancer screening usually begins at 30 years of age and continues through 30 years of age.  Your health care provider may recommend screening at an earlier age if you have risk factors for colon cancer.  Your health care provider may also recommend using home test kits to check for hidden blood in the stool.  A small camera at the end of a tube can be used to examine your colon directly (sigmoidoscopy or colonoscopy). This is done to check for the earliest forms of colorectal cancer.  Routine screening usually begins at age 88.  Direct examination of the colon should be repeated every 5-10 years through 30 years of age. However, you may need to be screened more often if early forms of precancerous polyps or small growths are found. Skin Cancer  Check your skin from head to toe regularly.  Tell your health care provider about any new moles or  changes in moles, especially if there is a change in a mole's shape or color.  Also tell your health care provider if you have a mole that is larger than the size of a pencil eraser.  Always use sunscreen. Apply sunscreen liberally and repeatedly throughout the day.  Protect yourself by wearing long sleeves, pants, a wide-brimmed hat, and sunglasses whenever you are outside. Heart disease, diabetes, and high blood pressure  High blood pressure causes heart disease and increases the risk of stroke. High blood pressure is more likely to develop in: ? People who have blood pressure in the high end of the normal range (130-139/85-89 mm Hg). ? People who are overweight or obese. ? People who are African American.  If you are 54-52 years of age, have your blood pressure checked every 3-5 years. If you are 91 years of age or older, have your blood pressure checked every year. You  should have your blood pressure measured twice-once when you are at a hospital or clinic, and once when you are not at a hospital or clinic. Record the average of the two measurements. To check your blood pressure when you are not at a hospital or clinic, you can use: ? An automated blood pressure machine at a pharmacy. ? A home blood pressure monitor.  If you are between 39 years and 68 years old, ask your health care provider if you should take aspirin to prevent strokes.  Have regular diabetes screenings. This involves taking a blood sample to check your fasting blood sugar level. ? If you are at a normal weight and have a low risk for diabetes, have this test once every three years after 30 years of age. ? If you are overweight and have a high risk for diabetes, consider being tested at a younger age or more often. Preventing infection Hepatitis B  If you have a higher risk for hepatitis B, you should be screened for this virus. You are considered at high risk for hepatitis B if: ? You were born in a country where hepatitis B is common. Ask your health care provider which countries are considered high risk. ? Your parents were born in a high-risk country, and you have not been immunized against hepatitis B (hepatitis B vaccine). ? You have HIV or AIDS. ? You use needles to inject street drugs. ? You live with someone who has hepatitis B. ? You have had sex with someone who has hepatitis B. ? You get hemodialysis treatment. ? You take certain medicines for conditions, including cancer, organ transplantation, and autoimmune conditions. Hepatitis C  Blood testing is recommended for: ? Everyone born from [birth year] through 1967. ? Anyone with known risk factors for hepatitis C. Sexually transmitted infections (STIs)  You should be screened for sexually transmitted infections (STIs) including gonorrhea and chlamydia if: ? You are sexually active and are younger than 30 years of age. ? You are  older than 30 years of age and your health care provider tells you that you are at risk for this type of infection. ? Your sexual activity has changed since you were last screened and you are at an increased risk for chlamydia or gonorrhea. Ask your health care provider if you are at risk.  If you do not have HIV, but are at risk, it may be recommended that you take a prescription medicine daily to prevent HIV infection. This is called pre-exposure prophylaxis (PrEP). You are considered at risk if: ? You are sexually  active and do not regularly use condoms or know the HIV status of your partner(s). ? You take drugs by injection. ? You are sexually active with a partner who has HIV. Talk with your health care provider about whether you are at high risk of being infected with HIV. If you choose to begin PrEP, you should first be tested for HIV. You should then be tested every 3 months for as long as you are taking PrEP. Pregnancy  If you are premenopausal and you may become pregnant, ask your health care provider about preconception counseling.  If you may become pregnant, take 400 to 800 micrograms (mcg) of folic acid every day.  If you want to prevent pregnancy, talk to your health care provider about birth control (contraception). Osteoporosis and menopause  Osteoporosis is a disease in which the bones lose minerals and strength with aging. This can result in serious bone fractures. Your risk for osteoporosis can be identified using a bone density scan.  If you are 50 years of age or older, or if you are at risk for osteoporosis and fractures, ask your health care provider if you should be screened.  Ask your health care provider whether you should take a calcium or vitamin D supplement to lower your risk for osteoporosis.  Menopause may have certain physical symptoms and risks.  Hormone replacement therapy may reduce some of these symptoms and risks. Talk to your health care provider  about whether hormone replacement therapy is right for you. Follow these instructions at home:  Schedule regular health, dental, and eye exams.  Stay current with your immunizations.  Do not use any tobacco products including cigarettes, chewing tobacco, or electronic cigarettes.  If you are pregnant, do not drink alcohol.  If you are breastfeeding, limit how much and how often you drink alcohol.  Limit alcohol intake to no more than 1 drink per day for nonpregnant women. One drink equals 12 ounces of beer, 5 ounces of wine, or 1 ounces of hard liquor.  Do not use street drugs.  Do not share needles.  Ask your health care provider for help if you need support or information about quitting drugs.  Tell your health care provider if you often feel depressed.  Tell your health care provider if you have ever been abused or do not feel safe at home. This information is not intended to replace advice given to you by your health care provider. Make sure you discuss any questions you have with your health care provider. Document Released: 11/09/2010 Document Revised: 10/02/2015 Document Reviewed: 01/28/2015 Elsevier Interactive Patient Education  2019 [employer].

## 2018-10-17 NOTE — Assessment & Plan Note (Addendum)
10/2018 Pain controlled - using lidocaine

## 2018-10-18 LAB — LIPID PANEL
Cholesterol: 186 mg/dL (ref 0–200)
HDL: 48 mg/dL (ref >39.00)
LDL Cholesterol: 118 mg/dL — ABNORMAL HIGH (ref 0–99)
NonHDL: 138.36
Total CHOL/HDL Ratio: 4
Triglycerides: 101 mg/dL (ref 0.0–149.0)
VLDL: 20.2 mg/dL (ref 0.0–40.0)

## 2018-10-18 LAB — COMPREHENSIVE METABOLIC PANEL
ALT: 15 U/L (ref 0–35)
AST: 14 U/L (ref 0–37)
Albumin: 4.3 g/dL (ref 3.5–5.2)
Alkaline Phosphatase: 59 U/L (ref 39–117)
BUN: 11 mg/dL (ref 6–23)
CO2: 27 mEq/L (ref 19–32)
Calcium: 9.8 mg/dL (ref 8.4–10.5)
Chloride: 103 mEq/L (ref 96–112)
Creatinine, Ser: 0.79 mg/dL (ref 0.40–1.20)
GFR: 85.54 mL/min (ref >60.00)
Glucose, Bld: 81 mg/dL (ref 70–99)
Potassium: 4.9 mEq/L (ref 3.5–5.1)
Sodium: 138 mEq/L (ref 135–145)
Total Bilirubin: 0.6 mg/dL (ref 0.2–1.2)
Total Protein: 7.1 g/dL (ref 6.0–8.3)

## 2018-10-18 LAB — TSH: TSH: 1.54 u[IU]/mL (ref 0.35–4.50)

## 2018-10-19 ENCOUNTER — Telehealth: Payer: Self-pay | Admitting: Internal Medicine

## 2018-10-19 NOTE — Telephone Encounter (Signed)
Pt. Given labs, verbalizes understanding. °

## 2018-11-13 NOTE — Progress Notes (Signed)
Subjective:    Patient ID: Lacey Sloan, female    DOB: 1991-04-25, 28 y.o.   MRN: [id number]  HPI The patient is here for follow up.  Anxiety, Depression: we started her on sertraline 4 weeks ago.  She is taking her medication daily as prescribed. She denies any side effects from the medication. She feels her depression is well controlled and she is happy with her current dose of medication.     Medications and allergies reviewed with patient and updated if appropriate.  Patient Active Problem List   Diagnosis Date Noted  . Shingles 10/17/2018  . Anxiety and depression 10/17/2018  . Low grade squamous intraepithelial lesion (LGSIL) on cervicovaginal cytologic smear 10/16/2018    Current Outpatient Medications on File Prior to Visit  Medication Sig Dispense Refill  . sertraline (ZOLOFT) 50 MG tablet Take 1 tablet (50 mg total) by mouth at bedtime. 30 tablet 5  . triamcinolone cream (KENALOG) 0.1 %      No current facility-administered medications on file prior to visit.     Past Medical History:  Diagnosis Date  . History of psoriasis   . Hx of varicella   . Vaginal Pap smear, abnormal     Past Surgical History:  Procedure Laterality Date  . DILATION AND CURETTAGE OF UTERUS      Social History   Socioeconomic History  . Marital status: Married    Spouse name: Not on file  . Number of children: Not on file  . Years of education: Not on file  . Highest education level: Not on file  Occupational History  . Not on file  Social Needs  . Financial resource strain: Not on file  . Food insecurity    Worry: Not on file    Inability: Not on file  . Transportation needs    Medical: Not on file    Non-medical: Not on file  Tobacco Use  . Smoking status: Former Research scientist (life sciences)  . Smokeless tobacco: Former Network engineer and Sexual Activity  . Alcohol use: Yes    Comment: socially   . Drug use: Never  . Sexual activity: Not on file  Lifestyle  . Physical activity   Days per week: Not on file    Minutes per session: Not on file  . Stress: Not on file  Relationships  . Social Herbalist on phone: Not on file    Gets together: Not on file    Attends religious service: Not on file    Active member of club or organization: Not on file    Attends meetings of clubs or organizations: Not on file    Relationship status: Not on file  Other Topics Concern  . Not on file  Social History Narrative   Married, 73 year old son   Works at [employer]   No regular exercise    Family History  Problem Relation Age of Onset  . Diabetes Maternal Grandmother   . Heart disease Paternal Grandfather   . Cancer Paternal Grandfather     Review of Systems     Objective:  There were no vitals filed for this visit. BP Readings from Last 3 Encounters:  10/17/18 112/68  02/15/18 136/84  10/22/16 116/67   Wt Readings from Last 3 Encounters:  10/17/18 250 lb 12.8 oz (113.8 kg)  10/20/16 (!) 315 lb (142.9 kg)   There is no height or weight on file to calculate BMI.  Physical Exam    Constitutional: Appears well-developed and well-nourished. No distress.  HENT:  Head: Normocephalic and atraumatic.  Neck: Neck supple. No tracheal deviation present. No thyromegaly present.  No cervical lymphadenopathy Cardiovascular: Normal rate, regular rhythm and normal heart sounds.   No murmur heard. No carotid bruit .  No edema Pulmonary/Chest: Effort normal and breath sounds normal. No respiratory distress. No has no wheezes. No rales.  Skin: Skin is warm and dry. Not diaphoretic.  Psychiatric: Normal mood and affect. Behavior is normal.      Assessment & Plan:    See Problem List for Assessment and Plan of chronic medical problems.

## 2018-11-14 ENCOUNTER — Encounter: Payer: Self-pay | Admitting: Internal Medicine

## 2018-11-14 ENCOUNTER — Ambulatory Visit (INDEPENDENT_AMBULATORY_CARE_PROVIDER_SITE_OTHER): Payer: 59 | Admitting: Internal Medicine

## 2018-11-14 DIAGNOSIS — F419 Anxiety disorder, unspecified: Secondary | ICD-10-CM

## 2018-11-14 DIAGNOSIS — F32A Depression, unspecified: Secondary | ICD-10-CM

## 2018-11-14 DIAGNOSIS — F329 Major depressive disorder, single episode, unspecified: Secondary | ICD-10-CM | POA: Diagnosis not present

## 2018-11-14 NOTE — Progress Notes (Signed)
Virtual Visit via Video Note  I connected with Lacey Sloan on 11/14/18 at  3:45 PM EDT by a video enabled telemedicine application and verified that I am speaking with the correct person using two identifiers.   I discussed the limitations of evaluation and management by telemedicine and the availability of in person appointments. The patient expressed understanding and agreed to proceed.  The patient is currently at home and I am in the office.    No referring provider.    History of Present Illness: She is here for follow up of her chronic medical conditions.   Anxiety, Depression: we started her on sertraline 4 weeks ago.  She is taking her medication daily as prescribed. She denies any side effects from the medication right now, but when she first started it she did have some nausea and fatigue, which have resolved. She feels her depression and anxiety are better.  She has not had as many bad days.  She still feels she is getting used to the medication and would like to stay on this dose longer before making any changes.  She has no thoughts of suicide.  She is eating normally and sleeping better.   Social History   Socioeconomic History  . Marital status: Married    Spouse name: Not on file  . Number of children: Not on file  . Years of education: Not on file  . Highest education level: Not on file  Occupational History  . Not on file  Social Needs  . Financial resource strain: Not on file  . Food insecurity    Worry: Not on file    Inability: Not on file  . Transportation needs    Medical: Not on file    Non-medical: Not on file  Tobacco Use  . Smoking status: Former Research scientist (life sciences)  . Smokeless tobacco: Former Network engineer and Sexual Activity  . Alcohol use: Yes    Comment: socially   . Drug use: Never  . Sexual activity: Not on file  Lifestyle  . Physical activity    Days per week: Not on file    Minutes per session: Not on file  . Stress: Not on file   Relationships  . Social Herbalist on phone: Not on file    Gets together: Not on file    Attends religious service: Not on file    Active member of club or organization: Not on file    Attends meetings of clubs or organizations: Not on file    Relationship status: Not on file  Other Topics Concern  . Not on file  Social History Narrative   Married, 78 year old son   Works at [employer]   No regular exercise     Observations/Objective: Appears well in NAD Mood and affect appear normal.  Thought process normal.  Assessment and Plan:  See Problem List for Assessment and Plan of chronic medical problems.   Follow Up Instructions:    I discussed the assessment and treatment plan with the patient. The patient was provided an opportunity to ask questions and all were answered. The patient agreed with the plan and demonstrated an understanding of the instructions.   The patient was advised to call back or seek an in-person evaluation if the symptoms worsen or if the condition fails to improve as anticipated.    Binnie Rail, MD

## 2018-11-14 NOTE — Assessment & Plan Note (Signed)
Anxiety and depression improved with sertraline.  She did have some transient side effects, but those have resolved She would like to continue the current dose for now, but may consider an increase in dose in the near future-she will let me know and we can increase over the phone For now continue sertraline 50 mg nightly

## 2019-05-08 ENCOUNTER — Other Ambulatory Visit: Payer: Self-pay | Admitting: Internal Medicine

## 2019-08-27 NOTE — Patient Instructions (Addendum)
Make an appointment for sports medicine downstairs.   All other Health Maintenance issues reviewed.   All recommended immunizations and age-appropriate screenings are up-to-date or discussed.  No immunization administered today.   Medications reviewed and updated.  Changes include :   Increase sertraline to 100 mg daily.  Try imitrex for your headaches.    Your prescription(s) have been submitted to your pharmacy. Please take as directed and contact our office if you believe you are having problem(s) with the medication(s).    Please followup in 2 months    Health Maintenance, Female Adopting a healthy lifestyle and getting preventive care are important in promoting health and wellness. Ask your health care provider about:  The right schedule for you to have regular tests and exams.  Things you can do on your own to prevent diseases and keep yourself healthy. What should I know about diet, weight, and exercise? Eat a healthy diet   Eat a diet that includes plenty of vegetables, fruits, low-fat dairy products, and lean protein.  Do not eat a lot of foods that are high in solid fats, added sugars, or sodium. Maintain a healthy weight Body mass index (BMI) is used to identify weight problems. It estimates body fat based on height and weight. Your health care provider can help determine your BMI and help you achieve or maintain a healthy weight. Get regular exercise Get regular exercise. This is one of the most important things you can do for your health. Most adults should:  Exercise for at least 150 minutes each week. The exercise should increase your heart rate and make you sweat (moderate-intensity exercise).  Do strengthening exercises at least twice a week. This is in addition to the moderate-intensity exercise.  Spend less time sitting. Even light physical activity can be beneficial. Watch cholesterol and blood lipids Have your blood tested for lipids and cholesterol at 31  years of age, then have this test every 5 years. Have your cholesterol levels checked more often if:  Your lipid or cholesterol levels are high.  You are older than 31 years of age.  You are at high risk for heart disease. What should I know about cancer screening? Depending on your health history and family history, you may need to have cancer screening at various ages. This may include screening for:  Breast cancer.  Cervical cancer.  Colorectal cancer.  Skin cancer.  Lung cancer. What should I know about heart disease, diabetes, and high blood pressure? Blood pressure and heart disease  High blood pressure causes heart disease and increases the risk of stroke. This is more likely to develop in people who have high blood pressure readings, are of African descent, or are overweight.  Have your blood pressure checked: ? Every 3-5 years if you are 45-28 years of age. ? Every year if you are 69 years old or older. Diabetes Have regular diabetes screenings. This checks your fasting blood sugar level. Have the screening done:  Once every three years after age 30 if you are at a normal weight and have a low risk for diabetes.  More often and at a younger age if you are overweight or have a high risk for diabetes. What should I know about preventing infection? Hepatitis B If you have a higher risk for hepatitis B, you should be screened for this virus. Talk with your health care provider to find out if you are at risk for hepatitis B infection. Hepatitis C Testing is recommended for:  Everyone born from [birth year] through 1967.  Anyone with known risk factors for hepatitis C. Sexually transmitted infections (STIs)  Get screened for STIs, including gonorrhea and chlamydia, if: ? You are sexually active and are younger than 31 years of age. ? You are older than 31 years of age and your health care provider tells you that you are at risk for this type of infection. ? Your sexual  activity has changed since you were last screened, and you are at increased risk for chlamydia or gonorrhea. Ask your health care provider if you are at risk.  Ask your health care provider about whether you are at high risk for HIV. Your health care provider may recommend a prescription medicine to help prevent HIV infection. If you choose to take medicine to prevent HIV, you should first get tested for HIV. You should then be tested every 3 months for as long as you are taking the medicine. Pregnancy  If you are about to stop having your period (premenopausal) and you may become pregnant, seek counseling before you get pregnant.  Take 400 to 800 micrograms (mcg) of folic acid every day if you become pregnant.  Ask for birth control (contraception) if you want to prevent pregnancy. Osteoporosis and menopause Osteoporosis is a disease in which the bones lose minerals and strength with aging. This can result in bone fractures. If you are 17 years old or older, or if you are at risk for osteoporosis and fractures, ask your health care provider if you should:  Be screened for bone loss.  Take a calcium or vitamin D supplement to lower your risk of fractures.  Be given hormone replacement therapy (HRT) to treat symptoms of menopause. Follow these instructions at home: Lifestyle  Do not use any products that contain nicotine or tobacco, such as cigarettes, e-cigarettes, and chewing tobacco. If you need help quitting, ask your health care provider.  Do not use street drugs.  Do not share needles.  Ask your health care provider for help if you need support or information about quitting drugs. Alcohol use  Do not drink alcohol if: ? Your health care provider tells you not to drink. ? You are pregnant, may be pregnant, or are planning to become pregnant.  If you drink alcohol: ? Limit how much you use to 0-1 drink a day. ? Limit intake if you are breastfeeding.  Be aware of how much  alcohol is in your drink. In the [place] one drink equals one 12 oz bottle of beer (355 mL), one 5 oz glass of wine (148 mL), or one 1 oz glass of hard liquor (44 mL). General instructions  Schedule regular health, dental, and eye exams.  Stay current with your vaccines.  Tell your health care provider if: ? You often feel depressed. ? You have ever been abused or do not feel safe at home. Summary  Adopting a healthy lifestyle and getting preventive care are important in promoting health and wellness.  Follow your health care provider's instructions about healthy diet, exercising, and getting tested or screened for diseases.  Follow your health care provider's instructions on monitoring your cholesterol and blood pressure. This information is not intended to replace advice given to you by your health care provider. Make sure you discuss any questions you have with your health care provider. Document Revised: 04/19/2018 Document Reviewed: 04/19/2018 Elsevier Patient Education  2020 [employer].

## 2019-08-27 NOTE — Progress Notes (Signed)
Subjective:    Patient ID: Lacey Sloan, female    DOB: 07/08/1990, 29 y.o.   MRN: [id number]  HPI She is here for a physical exam.   Her right knee hurts and pops.  It pops with bending her knee.  She has pain on both sides of her knee with going up stairs.    She has had lower back since her epidural 3 years ago.  Lately she has been very stressed, crying and moody.  taking sertraline for approximately 1 year - helped initially. Home life is very stressful - may leave husband.    More headaches - can last 2-3 days.  Having them 1-2 /month.  No aura.  Photophobia, no phonophobia.  Usually temples or back of neck. No obvious pattern. Sometimes take advil or excedrin, aleve - sometimes helps - often does not relieve it.  Grandma has migraines.      Medications and allergies reviewed with patient and updated if appropriate.  Patient Active Problem List   Diagnosis Date Noted  . Shingles 10/17/2018  . Anxiety and depression 10/17/2018  . Low grade squamous intraepithelial lesion (LGSIL) on cervicovaginal cytologic smear 10/16/2018    No current outpatient medications on file prior to visit.   No current facility-administered medications on file prior to visit.    Past Medical History:  Diagnosis Date  . History of psoriasis   . Hx of varicella   . Vaginal Pap smear, abnormal     Past Surgical History:  Procedure Laterality Date  . DILATION AND CURETTAGE OF UTERUS      Social History   Socioeconomic History  . Marital status: Married    Spouse name: Not on file  . Number of children: Not on file  . Years of education: Not on file  . Highest education level: Not on file  Occupational History  . Not on file  Tobacco Use  . Smoking status: Former Research scientist (life sciences)  . Smokeless tobacco: Former Network engineer and Sexual Activity  . Alcohol use: Yes    Comment: socially   . Drug use: Never  . Sexual activity: Not on file  Other Topics Concern  . Not on file  Social  History Narrative   Married, 66 year old son   Works at [employer]   No regular exercise   Social Determinants of Radio broadcast assistant Strain:   . Difficulty of Paying Living Expenses:   Food Insecurity:   . Worried About Charity fundraiser in the Last Year:   . Arboriculturist in the Last Year:   Transportation Needs:   . Film/video editor (Medical):   Marland Kitchen Lack of Transportation (Non-Medical):   Physical Activity:   . Days of Exercise per Week:   . Minutes of Exercise per Session:   Stress:   . Feeling of Stress :   Social Connections:   . Frequency of Communication with Friends and Family:   . Frequency of Social Gatherings with Friends and Family:   . Attends Religious Services:   . Active Member of Clubs or Organizations:   . Attends Archivist Meetings:   Marland Kitchen Marital Status:     Family History  Problem Relation Age of Onset  . Diabetes Maternal Grandmother   . Heart disease Paternal Grandfather   . Cancer Paternal Grandfather     Review of Systems  Constitutional: Negative for chills and fever.  Eyes: Negative for visual disturbance.  Respiratory: Negative for cough, shortness of breath and wheezing.   Cardiovascular: Negative for chest pain, palpitations and leg swelling.  Gastrointestinal: Negative for abdominal pain, blood in stool, constipation, diarrhea and nausea.       No gerd  Genitourinary: Negative for dysuria and hematuria.  Musculoskeletal: Positive for arthralgias and back pain.  Skin: Negative for color change and rash.  Neurological: Positive for dizziness (occ), light-headedness (occ) and headaches.  Psychiatric/Behavioral: Positive for dysphoric mood. Negative for sleep disturbance. The patient is nervous/anxious.        Objective:   Vitals:   08/28/19 1449  BP: 118/68  Pulse: 63  Resp: 16  Temp: 98.5 F (36.9 C)  SpO2: 99%   Filed Weights   08/28/19 1449  Weight: 211 lb (95.7 kg)   Body mass index is 35.11  kg/m.  BP Readings from Last 3 Encounters:  08/28/19 118/68  10/17/18 112/68  02/15/18 136/84    Wt Readings from Last 3 Encounters:  08/28/19 211 lb (95.7 kg)  10/17/18 250 lb 12.8 oz (113.8 kg)  10/20/16 (!) 315 lb (142.9 kg)     Physical Exam Constitutional: She appears well-developed and well-nourished. No distress.  HENT:  Head: Normocephalic and atraumatic.  Right Ear: External ear normal. Normal ear canal and TM Left Ear: External ear normal.  Normal ear canal and TM Mouth/Throat: Oropharynx is clear and moist.  Eyes: Conjunctivae and EOM are normal.  Neck: Neck supple. No tracheal deviation present. No thyromegaly present.  No carotid bruit  Cardiovascular: Normal rate, regular rhythm and normal heart sounds.   No murmur heard.  No edema. Pulmonary/Chest: Effort normal and breath sounds normal. No respiratory distress. She has no wheezes. She has no rales.  Breast: deferred   Abdominal: Soft. She exhibits no distension. There is no tenderness.  Lymphadenopathy: She has no cervical adenopathy.  Skin: Skin is warm and dry. She is not diaphoretic.  Psychiatric: She has a normal mood and affect. Her behavior is normal.        Assessment & Plan:   Physical exam: Screening blood work    ordered Immunizations  Up to date  Gyn    Up to date   Exercise  Walking dogs, with work [employer]  encouraged weight loss - has lost weight Substance abuse  none  See Problem List for Assessment and Plan of chronic medical problems.    This visit occurred during the SARS-CoV-2 public health emergency.  Safety protocols were in place, including screening questions prior to the visit, additional usage of staff PPE, and extensive cleaning of exam room while observing appropriate contact time as indicated for disinfecting solutions.

## 2019-08-28 ENCOUNTER — Other Ambulatory Visit: Payer: Self-pay

## 2019-08-28 ENCOUNTER — Ambulatory Visit (INDEPENDENT_AMBULATORY_CARE_PROVIDER_SITE_OTHER): Payer: 59 | Admitting: Internal Medicine

## 2019-08-28 ENCOUNTER — Encounter: Payer: Self-pay | Admitting: Internal Medicine

## 2019-08-28 VITALS — BP 118/68 | HR 63 | Temp 98.5°F | Resp 16 | Ht 65.0 in | Wt 211.0 lb

## 2019-08-28 DIAGNOSIS — M545 Low back pain, unspecified: Secondary | ICD-10-CM | POA: Insufficient documentation

## 2019-08-28 DIAGNOSIS — R519 Headache, unspecified: Secondary | ICD-10-CM

## 2019-08-28 DIAGNOSIS — Z0000 Encounter for general adult medical examination without abnormal findings: Secondary | ICD-10-CM | POA: Diagnosis not present

## 2019-08-28 DIAGNOSIS — M25561 Pain in right knee: Secondary | ICD-10-CM | POA: Diagnosis not present

## 2019-08-28 DIAGNOSIS — F419 Anxiety disorder, unspecified: Secondary | ICD-10-CM | POA: Diagnosis not present

## 2019-08-28 DIAGNOSIS — G8929 Other chronic pain: Secondary | ICD-10-CM | POA: Insufficient documentation

## 2019-08-28 DIAGNOSIS — G43909 Migraine, unspecified, not intractable, without status migrainosus: Secondary | ICD-10-CM | POA: Insufficient documentation

## 2019-08-28 DIAGNOSIS — F329 Major depressive disorder, single episode, unspecified: Secondary | ICD-10-CM

## 2019-08-28 MED ORDER — SUMATRIPTAN SUCCINATE 50 MG PO TABS
50.0000 mg | ORAL_TABLET | ORAL | 5 refills | Status: DC | PRN
Start: 2019-08-28 — End: 2020-11-04

## 2019-08-28 MED ORDER — SERTRALINE HCL 100 MG PO TABS: 100.0000 mg | ORAL_TABLET | Freq: Every day | ORAL | 1 refills | Status: DC

## 2019-08-28 NOTE — Assessment & Plan Note (Signed)
Acute Nonpainful popping with bending knee and pain on lateral aspects of knee with going up stairs Will refer to sports medicine for further evaluation

## 2019-08-28 NOTE — Assessment & Plan Note (Signed)
Acute on chronic Has had frequent headaches, but they are more frequent now-has headaches about 1-2 times a month that can last 3 days Associated with photophobia, no aura Over-the-counter medications-ibuprofen, Aleve, Excedrin help but do not relieve the headache ?  Migraines or tension headaches No obvious triggers Encouraged headache log Will try Imitrex to see if that helps Follow-up in 2 months

## 2019-08-28 NOTE — Assessment & Plan Note (Signed)
Chronic Not ideally controlled Increased stress at home-having marriage issues Has been on sertraline 50 mg daily-we will increase to 100 mg daily Follow-up in 2 months, sooner if needed

## 2019-08-28 NOTE — Assessment & Plan Note (Signed)
Lower back pain since she had the epidural with the birth of her son Chronic We will refer to sports medicine for further evaluation-it does wake her up at night at times

## 2019-08-30 ENCOUNTER — Ambulatory Visit: Payer: 59 | Admitting: Family Medicine

## 2019-08-30 ENCOUNTER — Other Ambulatory Visit: Payer: Self-pay

## 2019-08-30 ENCOUNTER — Ambulatory Visit (INDEPENDENT_AMBULATORY_CARE_PROVIDER_SITE_OTHER): Payer: 59

## 2019-08-30 ENCOUNTER — Ambulatory Visit: Payer: Self-pay

## 2019-08-30 ENCOUNTER — Encounter: Payer: Self-pay | Admitting: Family Medicine

## 2019-08-30 VITALS — BP 100/60 | HR 72 | Ht 65.0 in | Wt 214.4 lb

## 2019-08-30 DIAGNOSIS — M545 Low back pain: Secondary | ICD-10-CM

## 2019-08-30 DIAGNOSIS — M25561 Pain in right knee: Secondary | ICD-10-CM

## 2019-08-30 DIAGNOSIS — M222X1 Patellofemoral disorders, right knee: Secondary | ICD-10-CM

## 2019-08-30 DIAGNOSIS — G8929 Other chronic pain: Secondary | ICD-10-CM | POA: Diagnosis not present

## 2019-08-30 NOTE — Patient Instructions (Signed)
Thank you for coming in today. Use heat and TENS unit on your back.  Use voltaren gel over the counter up to 4x daily for pain as needed.  Attend PT.  Let me know if you have any problems or issue or concerns.   Recheck in 6 weeks.   TENS UNIT: This is helpful for muscle pain and spasm.   Search and Purchase a TENS 7000 2nd edition at  www.tenspros.com or www.[employer].com It should be less than $30.     TENS unit instructions: Do not shower or bathe with the unit on Turn the unit off before removing electrodes or batteries If the electrodes lose stickiness add a drop of water to the electrodes after they are disconnected from the unit and place on plastic sheet. If you continued to have difficulty, call the TENS unit company to purchase more electrodes. Do not apply lotion on the skin area prior to use. Make sure the skin is clean and dry as this will help prolong the life of the electrodes. After use, always check skin for unusual red areas, rash or other skin difficulties. If there are any skin problems, does not apply electrodes to the same area. Never remove the electrodes from the unit by pulling the wires. Do not use the TENS unit or electrodes other than as directed. Do not change electrode placement without consultating your therapist or physician. Keep 2 fingers with between each electrode. Wear time ratio is 2:1, on to off times.    For example on for 30 minutes off for 15 minutes and then on for 30 minutes off for 15 minutes     Patellofemoral Pain Syndrome  Patellofemoral pain syndrome is a condition in which the tissue (cartilage) on the underside of the kneecap (patella) softens or breaks down. This causes pain in the front of the knee. The condition is also called runner's knee or chondromalacia patella. Patellofemoral pain syndrome is most common in young adults who are active in sports. The knee is the largest joint in the body. The patella covers the front of the  knee and is attached to muscles above and below the knee. The underside of the patella is covered with a smooth type of cartilage (synovium). The smooth surface helps the patella to glide easily when you move your knee. Patellofemoral pain syndrome causes swelling in the joint linings and bone surfaces in the knee. What are the causes? This condition may be caused by: Overuse of the knee. Poor alignment of your knee joints. Weak leg muscles. A direct blow to your kneecap. What increases the risk? You are more likely to develop this condition if: You do a lot of activities that can wear down your kneecap. These include: Running. Squatting. Climbing stairs. You start a new physical activity or exercise program. You wear shoes that do not fit well. You do not have good leg strength. You are overweight. What are the signs or symptoms? The main symptom of this condition is knee pain. This may feel like a dull, aching pain underneath your patella, in the front of your knee. There may be a popping or cracking sound when you move your knee. Pain may get worse with: Exercise. Climbing stairs. Running. Jumping. Squatting. Kneeling. Sitting for a long time. Moving or pushing on your patella. How is this diagnosed? This condition may be diagnosed based on: Your symptoms and medical history. You may be asked about your recent physical activities and which ones cause knee pain. A  physical exam. This may include: Moving your patella back and forth. Checking your range of knee motion. Having you squat or jump to see if you have pain. Checking the strength of your leg muscles. Imaging tests to confirm the diagnosis. These may include an MRI of your knee. How is this treated? This condition may be treated at home with rest, ice, compression, and elevation (RICE).  Other treatments may include: Nonsteroidal anti-inflammatory drugs (NSAIDs). Physical therapy to stretch and strengthen your leg  muscles. Shoe inserts (orthotics) to take stress off your knee. A knee brace or knee support. Adhesive tapes to the skin. Surgery to remove damaged cartilage or move the patella to a better position. This is rare. Follow these instructions at home: If you have a shoe or brace: Wear the shoe or brace as told by your health care provider. Remove it only as told by your health care provider. Loosen the shoe or brace if your toes tingle, become numb, or turn cold and blue. Keep the shoe or brace clean. If the shoe or brace is not waterproof: Do not let it get wet. Cover it with a watertight covering when you take a bath or a shower. Managing pain, stiffness, and swelling If directed, put ice on the painful area. If you have a removable shoe or brace, remove it as told by your health care provider. Put ice in a plastic bag. Place a towel between your skin and the bag. Leave the ice on for 20 minutes, 2-3 times a day. Move your toes often to avoid stiffness and to lessen swelling. Rest your knee: Avoid activities that cause knee pain. When sitting or lying down, raise (elevate) the injured area above the level of your heart, whenever possible. General instructions Take over-the-counter and prescription medicines only as told by your health care provider. Use splints, braces, knee supports, or walking aids as directed by your health care provider. Perform stretching and strengthening exercises as told by your health care provider or physical therapist. Do not use any products that contain nicotine or tobacco, such as cigarettes and e-cigarettes. These can delay healing. If you need help quitting, ask your health care provider. Return to your normal activities as told by your health care provider. Ask your health care provider what activities are safe for you. Keep all follow-up visits as told by your health care provider. This is important. Contact a health care provider if: Your symptoms get  worse. You are not improving with home care. Summary Patellofemoral pain syndrome is a condition in which the tissue (cartilage) on the underside of the kneecap (patella) softens or breaks down. This condition causes swelling in the joint linings and bone surfaces in the knee. This leads to pain in the front of the knee. This condition may be treated at home with rest, ice, compression, and elevation (RICE). Use splints, braces, knee supports, or walking aids as directed by your health care provider. This information is not intended to replace advice given to you by your health care provider. Make sure you discuss any questions you have with your health care provider. Document Revised: 06/06/2017 Document Reviewed: 06/06/2017 Elsevier Patient Education  2020 [employer].

## 2019-08-30 NOTE — Progress Notes (Signed)
Subjective:    I'm seeing this patient as a consultation for: Dr. Quay Burow. Note will be routed back to referring provider/PCP.  CC: Low back pain and R knee pain  I, Molly Weber, LAT, ATC, am serving as scribe for Dr. Lynne Leader.  HPI: Pt is a 29 y/o female presenting w/ c/o low back pain and R knee pain  Low back pain: LBP x 3 years since having an epidural w/ the birth of her son.   -Radiating pain: No -LE numbness/tingling: No -Aggravating factors: Laying prone or sidelying in bed; prolonged sitting/driving; lifting heavier things -Treatments tried: CBD cream; heat; Tylenol, Advil and Aleve  R knee pain: Pain x 9 months w/ no specific MOI.  She locates her pain to behind her patella -Swelling: Yes -Mechanical symptoms: Yes, popping -Aggravating factors: Squatting, stairs -Treatments tried: Brace, heat, ice, Tylenol, Advil and Aleve  Patient works as a Development worker, community carrier.  Past medical history, Surgical history, Family history, Social history, Allergies, and medications have been entered into the medical record, reviewed.   Review of Systems: No new headache, visual changes, nausea, vomiting, diarrhea, constipation, dizziness, abdominal pain, skin rash, fevers, chills, night sweats, weight loss, swollen lymph nodes, body aches, joint swelling, muscle aches, chest pain, shortness of breath, mood changes, visual or auditory hallucinations.   Objective:    Vitals:   08/30/19 1524  BP: 100/60  Pulse: 72  SpO2: 98%   General: Well Developed, well nourished, and in no acute distress.  Neuro/Psych: Alert and oriented x3, extra-ocular muscles intact, able to move all 4 extremities, sensation grossly intact. Skin: Warm and dry, no rashes noted.  Respiratory: Not using accessory muscles, speaking in full sentences, trachea midline.  Cardiovascular: Pulses palpable, no extremity edema. Abdomen: Does not appear distended. MSK:  L-spine: Normal-appearing Mildly tender palpation inferior  portion of lumbar spine at midline and lumbar paraspinal musculature and SI joints. Normal lumbar motion. Lower extremity strength is intact. Reflexes and sensation are intact distally.  Right knee: No effusion. Dynamic genu valgum with squat position Motion 0-120 degrees with crepitation. Tender palpation mildly medial and lateral joint line. Stable ligamentous exam. Intact strength. Negative patellar apprehension test.   Lab and Radiology Results  X-ray images L-spine and right knee obtained today personally and independently reviewed.  L-spine: DJD and facet DDD L5-S1 mild to moderate.  No acute fractures or malalignment.  Right knee: Mild medial compartment DJD and mild patellofemoral compartment DJD.  No acute fractures.  Await formal radiology review  Impression and Recommendations:    Assessment and Plan: 29 y.o. female with chronic low back pain.  Pain ongoing for 3 years following epidural as part of labor.  Patient does have degenerative changes seen on imaging today.  Discussed options.  Plan for trial of physical therapy TENS unit and heating pad.  Recheck 6 weeks.  Knee pain: Ongoing for several months now.  Patient does have some dynamic genu valgus however the majority of her pain is patellofemoral pain versus patellofemoral chondromalacia.  Quad strengthening and hip abductor strengthening should be helpful.  Also consider Kinesiotape.  Again refer to physical therapy.  Trial of Voltaren gel and recheck in 6 weeks..   Orders Placed This Encounter  Procedures  . DG Lumbar Spine Complete    Standing Status:   Future    Number of Occurrences:   1    Standing Expiration Date:   10/29/2020    Order Specific Question:   Reason for Exam (SYMPTOM  OR DIAGNOSIS REQUIRED)    Answer:   eval chronic low back pain    Order Specific Question:   Is patient pregnant?    Answer:   No    Order Specific Question:   Preferred imaging location?    Answer:   [facility]     Order Specific Question:   Radiology Contrast Protocol - do NOT remove file path    Answer:   \\[email or web address]\Radiant\DXFluoroContrastProtocols.pdf  . DG Knee AP/LAT W/Sunrise Right    Standing Status:   Future    Number of Occurrences:   1    Standing Expiration Date:   10/29/2020    Order Specific Question:   Reason for Exam (SYMPTOM  OR DIAGNOSIS REQUIRED)    Answer:   eval right knee pain    Order Specific Question:   Is patient pregnant?    Answer:   No    Order Specific Question:   Preferred imaging location?    Answer:   [facility]    Order Specific Question:   Radiology Contrast Protocol - do NOT remove file path    Answer:   \\[email or web address]\Radiant\DXFluoroContrastProtocols.pdf  . Ambulatory referral to Physical Therapy    Referral Priority:   Routine    Referral Type:   Physical Medicine    Referral Reason:   Specialty Services Required    Requested Specialty:   Physical Therapy   No orders of the defined types were placed in this encounter.   Discussed warning signs or symptoms. Please see discharge instructions. Patient expresses understanding.   The above documentation has been reviewed and is accurate and complete [name]

## 2019-08-31 NOTE — Progress Notes (Signed)
X-ray lumbar spine shows mild arthritis at the very low back.  Otherwise it was normal.

## 2019-08-31 NOTE — Progress Notes (Signed)
Knee x-ray shows trace arthritis at the kneecap

## 2019-09-13 ENCOUNTER — Ambulatory Visit: Payer: 59 | Attending: Family Medicine

## 2019-09-13 ENCOUNTER — Other Ambulatory Visit: Payer: Self-pay

## 2019-09-13 DIAGNOSIS — M25561 Pain in right knee: Secondary | ICD-10-CM | POA: Diagnosis present

## 2019-09-13 DIAGNOSIS — G8929 Other chronic pain: Secondary | ICD-10-CM | POA: Insufficient documentation

## 2019-09-13 DIAGNOSIS — M545 Low back pain: Secondary | ICD-10-CM | POA: Diagnosis present

## 2019-09-16 NOTE — Therapy (Signed)
[place], [place], [address] Phone: [phone]   Fax:  [phone]  Physical Therapy Evaluation  Patient Details  Name: Lacey Sloan MRN: [id number] Date of Birth: [birth year] Referring Provider (PT): Lynne Leader, MD   Encounter Date: 09/13/2019  PT End of Session - 09/16/19 0000    Visit Number  1    Number of Visits  7    Date for PT Re-Evaluation  11/11/19    Authorization Type  [employer]    PT Start Time  1532    PT Stop Time  1623    PT Time Calculation (min)  51 min    Activity Tolerance  Patient tolerated treatment well    Behavior During Therapy  [facility] for tasks assessed/performed       Past Medical History:  Diagnosis Date  . History of psoriasis   . Hx of varicella   . Vaginal Pap smear, abnormal     Past Surgical History:  Procedure Laterality Date  . DILATION AND CURETTAGE OF UTERUS      There were no vitals filed for this visit.   Subjective Assessment - 09/15/19 2338    Subjective  Pt reports the low back pain is her most significant concern. Pt states she has been experiencing increase pain levels and frequency over the past 3 years. She states the pain started following the birth of her son.    Limitations  Sitting;Lifting;Standing;Walking;House hold activities    How long can you sit comfortably?  15 mins    How long can you stand comfortably?  1 hour    How long can you walk comfortably?  3 miles    Diagnostic tests  X ray lumber spine: IMPRESSION:Mild L5-S1 disc space narrowing and endplate spurring. Otherwise negative. X ray R knee: IMPRESSION:Trace patellofemoral spurring.    Patient Stated Goals  To be active with no or little pain.    Currently in Pain?  Yes    Pain Score  4     Pain Location  Back    Pain Orientation  Posterior;Lower    Pain Descriptors / Indicators  Aching    Pain Type  Chronic pain    Pain Radiating Towards  NA    Pain Onset  More than a  month ago    Pain Frequency  Intermittent    Aggravating Factors   Prolonged activities    Pain Relieving Factors  Changing positions    Effect of Pain on Daily Activities  Pt states she completes what she wants to do despite the pain    Multiple Pain Sites  --   R knee pain not assessed today        [facility] PT Assessment - 09/16/19 0001      Assessment   Medical Diagnosis  Chronic midline LBP and R knee pain    Referring Provider (PT)  Lynne Leader, MD    Onset Date/Surgical Date  --   3 years ago   Next MD Visit  10/11/19    Prior Therapy  none      Precautions   Precautions  None      Restrictions   Weight Bearing Restrictions  No      Balance Screen   Has the patient fallen in the past 6 months  No    Has the patient had a decrease in activity level because of a fear of falling?   No    Is  the patient reluctant to leave their home because of a fear of falling?   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; repetitive bending and reaching      Cognition   Overall Cognitive Status  Within Functional Limits for tasks assessed      Observation/Other Assessments   Focus on Therapeutic Outcomes (FOTO)   43% for R knee      Sensation   Light Touch  Appears Intact      Coordination   Gross Motor Movements are Fluid and Coordinated  Yes      Posture/Postural Control   Posture/Postural Control  Postural limitations    Postural Limitations  Forward head;Rounded Shoulders      ROM / Strength   AROM / PROM / Strength  AROM;Strength      AROM   Lumbar Flexion  Full   pain c returning to upright    Lumbar Extension  Full   pain low back   Lumbar - Right Side Bend  Full   pain L low back   Lumbar - Left Side Bend  Full    pain R low back   Lumbar - Right Rotation  Full    Lumbar - Left Rotation  Full      Strength   Overall Strength Comments  Myotome testing WNLs      Palpation   SI assessment   Distraction; compression; ASIS, medial malleloi, PSIS are symmetrical    Palpation comment  TTP midline of L4-S1      Special Tests    Special Tests  Lumbar    Lumbar Tests  Slump Test;Straight Leg Raise      Slump test   Findings  Negative      Straight Leg Raise   Findings  Negative    Comment  L and R were greater than 80d                Objective measurements completed on examination: See above findings.              PT Education - 09/15/19 2358    Education Details  POC, HEP, and reviewed sleeping positions for comfort and pain reduction.    Person(s) Educated  Patient    Methods  Explanation;Demonstration;Tactile cues;Verbal cues;Handout    Comprehension  Verbalized understanding;Returned demonstration;Verbal cues required;Tactile cues required;Need further instruction       PT Short Term Goals - 09/16/19 0024      PT SHORT TERM GOAL #1   Title  Pt will be Ind in an initiail HEP    Baseline  No program    Time  3    Period  Weeks    Status  New    Target Date  10/07/19      PT SHORT TERM GOAL #2   Title  Complete eval of the R knee and establish a POC.    Baseline  No yet assessed  Time  2    Period  Weeks    Status  New    Target Date  09/30/19        PT Long Term Goals - 09/16/19 0029      PT LONG TERM GOAL #1   Title  Pt will be ind in a final HEP for her low back and R knee    Baseline  No program    Time  7    Period  Weeks    Status  New    Target Date  11/04/19      PT LONG TERM GOAL #2   Title  Pt will voice understanding of proper body mechanics to decrease low back pain and promote safer use of the low back with  home and work related activities    Baseline  Decreased understanding    Time  7    Period  Weeks    Status  New    Target Date  11/04/19      PT LONG TERM GOAL #3   Title  Pt will complete lifting of 40 lbs. c proper technique and for 5 reps for improved tolerance of work related activities    Baseline  Completing c back pain issues    Time  7    Period  Weeks    Status  New    Target Date  11/04/19      PT LONG TERM GOAL #4   Title  Pt will report tolerated daily activities in a pain range of 0-3/10    Baseline  0-6    Time  7    Period  Weeks    Status  New    Target Date  11/04/19             Plan - 09/15/19 2354    Clinical Impression Statement  Pt presents with gradually wrosening low back pain which she states started after the birth of her son 3 years ago. Pain is provoked c certain back movements. Back ROM is full. The slump test is negative. SI jt compression and distraction were negative and SI landmarks were symmetrical. Thea Silversmith directional assessment did not reveal a preferential movement pattern for redution of pain. Lumbosacral assessement did not reveal an overt cause of pain. Pt was started on stability exs for the core and pelvis. Pt will benefit from skilled PT to address low back pain and eval and address R knee pain.    Examination-Activity Limitations  Carry;Caring for Others;Bend;Lift;Sleep    Stability/Clinical Decision Making  Stable/Uncomplicated    Clinical Decision Making  Low    Rehab Potential  Good    PT Frequency  1x / week    PT Duration  6 weeks    PT Treatment/Interventions  ADLs/Self Care Home Management;Cryotherapy;Electrical Stimulation;Ultrasound;Traction;Moist Heat;Iontophoresis 4mg /ml Dexamethasone;Therapeutic activities;Therapeutic exercise;Neuromuscular re-education;Manual techniques;Patient/family education;Passive range of motion;Dry needling;Taping    PT Next Visit Plan  Asess response to initial HEP. Eval for R knee pain as  indicated.    PT Home Exercise Plan  [id number]: for lumbosacral and pelvic stability.       Patient will benefit from skilled therapeutic intervention in order to improve the following deficits and impairments:  Decreased activity tolerance, Postural dysfunction, Improper body mechanics, Pain, Decreased knowledge of precautions, Obesity  Visit Diagnosis: Chronic midline low back pain without sciatica - Plan: PT plan of care cert/re-cert  Chronic pain of right knee - Plan: PT plan of care cert/re-cert  Problem List Patient Active Problem List   Diagnosis Date Noted  . Right knee pain 08/28/2019  . Frequent headaches 08/28/2019  . Chronic midline low back pain without sciatica 08/28/2019  . Shingles 10/17/2018  . Anxiety and depression 10/17/2018  . Low grade squamous intraepithelial lesion (LGSIL) on cervicovaginal cytologic smear 10/16/2018    Joellyn Rued MS, PT 09/16/19 12:43 AM   [facility] [address] [place], [place], [address] Phone: [phone]   Fax:  [phone]  Name: [place] D Stitt MRN: [id number] Date of Birth: Apr 21, 1991

## 2019-09-26 ENCOUNTER — Other Ambulatory Visit: Payer: Self-pay

## 2019-09-26 ENCOUNTER — Ambulatory Visit: Payer: 59 | Admitting: Physical Therapy

## 2019-09-26 ENCOUNTER — Encounter: Payer: Self-pay | Admitting: Physical Therapy

## 2019-09-26 DIAGNOSIS — M545 Low back pain, unspecified: Secondary | ICD-10-CM

## 2019-09-26 DIAGNOSIS — M25561 Pain in right knee: Secondary | ICD-10-CM

## 2019-09-26 NOTE — Therapy (Addendum)
[place], [place], [address] Phone: [phone]   Fax:  [phone]  Physical Therapy Treatment/Discharge  Patient Details  Name: Lacey Sloan MRN: [id number] Date of Birth: 12/03/1990 Referring Provider (PT): Lynne Leader, MD   Encounter Date: 09/26/2019  PT End of Session - 09/26/19 1459    Visit Number  2    Number of Visits  7    Date for PT Re-Evaluation  11/11/19    Authorization Type  [employer]    PT Start Time  1500    PT Stop Time  1542    PT Time Calculation (min)  42 min    Activity Tolerance  Patient tolerated treatment well    Behavior During Therapy  [facility] for tasks assessed/performed       Past Medical History:  Diagnosis Date  . History of psoriasis   . Hx of varicella   . Vaginal Pap smear, abnormal     Past Surgical History:  Procedure Laterality Date  . DILATION AND CURETTAGE OF UTERUS      There were no vitals filed for this visit.  Subjective Assessment - 09/26/19 1500    Subjective  Pt reports that she is doing her HEP and thinks it may be helping some.  No back pain today only posterior Rt knee    Patient Stated Goals  To be active with no or little pain.    Currently in Pain?  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                        [facility] Adult PT Treatment/Exercise - 09/26/19 0001      Self-Care   Self-Care  Other Self-Care Comments    Other Self-Care Comments   knee alignment to avoid adduction      Exercises   Exercises  Lumbar      Lumbar Exercises: Stretches   Hip Flexor Stretch  2 reps;Left;Right;30 seconds   low lunge with knee on pad for comfort   Piriformis Stretch  Left;Right;30  seconds      Lumbar Exercises: Aerobic   Nustep  L4x5' LE only   VC for knee alignment     Lumbar Exercises: Machines for Strengthening   Leg Press  60# press bilat lower single, VC for alignment, 2x10      Lumbar Exercises: Standing   Other Standing Lumbar Exercises  dead lift work - prep for good form then lifting 30# kettle bell 3x5 reps       Lumbar Exercises: Supine   Bridge  --   2x15 with clam green band   Single Leg Bridge  15 reps   2 sets figure 4   Straight Leg Raise  20 reps   with PPT   Other Supine Lumbar Exercises  3x10 long sit SLR with ER Rt LE       Lumbar Exercises: Sidelying   Other Sidelying Lumbar Exercises  10 reps each exercise both sides, pilates FWD/BWD kicks, CW/CCW circles big and small, hot potato  PT Education - 09/26/19 1531    Education Details  HEWP progression    Person(s) Educated  Patient    Methods  Explanation;Handout    Comprehension  Returned demonstration;Verbalized understanding       PT Short Term Goals - 09/16/19 0024      PT SHORT TERM GOAL #1   Title  Pt will be Ind in an initiail HEP    Baseline  No program    Time  3    Period  Weeks    Status  New    Target Date  10/07/19      PT SHORT TERM GOAL #2   Title  Complete eval of the R knee and establish a POC.    Baseline  No yet assessed    Time  2    Period  Weeks    Status  New    Target Date  09/30/19        PT Long Term Goals - 09/16/19 0029      PT LONG TERM GOAL #1   Title  Pt will be ind in a final HEP for her low back and R knee    Baseline  No program    Time  7    Period  Weeks    Status  New    Target Date  11/04/19      PT LONG TERM GOAL #2   Title  Pt will voice understanding of proper body mechanics to decrease low back pain and promote safer use of the low back with home and work related activities    Baseline  Decreased understanding    Time  7    Period  Weeks    Status  New    Target Date  11/04/19      PT LONG TERM  GOAL #3   Title  Pt will complete lifting of 40 lbs. c proper technique and for 5 reps for improved tolerance of work related activities    Baseline  Completing c back pain issues    Time  7    Period  Weeks    Status  New    Target Date  11/04/19      PT LONG TERM GOAL #4   Title  Pt will report tolerated daily activities in a pain range of 0-3/10    Baseline  0-6    Time  7    Period  Weeks    Status  New    Target Date  11/04/19            Plan - 09/26/19 1537    Clinical Impression Statement  This is Jordans first visit after the eval,  Some of her exercises have become easy so these were progressed to be more challenging.  She did require VC for form as her knees adduct with functional movement/exercises. She should do well with therapy, no goals met.  Today was a good day for her back and minimal Rt knee pain that settled down with exercise.    Rehab Potential  Good    PT Frequency  1x / week    PT Duration  6 weeks    PT Treatment/Interventions  ADLs/Self Care Home Management;Cryotherapy;Electrical Stimulation;Ultrasound;Traction;Moist Heat;Iontophoresis 50m/ml Dexamethasone;Therapeutic activities;Therapeutic exercise;Neuromuscular re-education;Manual techniques;Patient/family education;Passive range of motion;Dry needling;Taping    PT Next Visit Plan  cont with core and hip strengtheing to support knee and low back    Consulted and Agree with Plan of Care  Patient       Patient will benefit from skilled therapeutic intervention in order to improve the following deficits and impairments:  Decreased activity tolerance, Postural dysfunction, Improper body mechanics, Pain, Decreased knowledge of precautions, Obesity  Visit Diagnosis: Chronic midline low back pain without sciatica  Chronic pain of right knee     Problem List Patient Active Problem List   Diagnosis Date Noted  . Right knee pain 08/28/2019  . Frequent headaches 08/28/2019  . Chronic midline low back  pain without sciatica 08/28/2019  . Shingles 10/17/2018  . Anxiety and depression 10/17/2018  . Low grade squamous intraepithelial lesion (LGSIL) on cervicovaginal cytologic smear 10/16/2018    Boneta Lucks rPT  09/26/2019, 3:44 PM  [facility] [address] [place], [place], [address] Phone: [phone]   Fax:  [phone]  Name: Lacey D Augustine MRN: [id number] Date of Birth: May 04, 1991  PHYSICAL THERAPY DISCHARGE SUMMARY  Visits from Start of Care: 2  Current functional level related to goals / functional outcomes: See above   Remaining deficits: See above   Education / Equipment: Anatomy of condition, POC, HEP, exercise form/rationale  Plan: Patient agrees to discharge.  Patient goals were not met. Patient is being discharged due to not returning since the last visit.  ?????     Jessica C. Hightower PT, DPT 01/08/20 6:39 PM

## 2019-10-03 ENCOUNTER — Ambulatory Visit: Payer: 59

## 2019-10-09 ENCOUNTER — Ambulatory Visit: Payer: 59

## 2019-10-11 ENCOUNTER — Ambulatory Visit (INDEPENDENT_AMBULATORY_CARE_PROVIDER_SITE_OTHER): Payer: 59 | Admitting: Family Medicine

## 2019-10-11 DIAGNOSIS — Z5329 Procedure and treatment not carried out because of patient's decision for other reasons: Secondary | ICD-10-CM

## 2019-10-11 NOTE — Progress Notes (Signed)
No show

## 2019-10-16 ENCOUNTER — Ambulatory Visit: Payer: 59

## 2019-10-25 ENCOUNTER — Ambulatory Visit: Payer: 59

## 2019-10-30 ENCOUNTER — Ambulatory Visit: Payer: 59 | Admitting: Internal Medicine

## 2020-03-19 LAB — OB RESULTS CONSOLE GC/CHLAMYDIA
Chlamydia: NEGATIVE
Gonorrhea: NEGATIVE

## 2020-03-31 ENCOUNTER — Other Ambulatory Visit: Payer: Self-pay | Admitting: Internal Medicine

## 2020-04-16 LAB — OB RESULTS CONSOLE RUBELLA ANTIBODY, IGM: Rubella: IMMUNE

## 2020-04-16 LAB — OB RESULTS CONSOLE HIV ANTIBODY (ROUTINE TESTING): HIV: NONREACTIVE

## 2020-04-16 LAB — HEPATITIS C ANTIBODY: HCV Ab: NEGATIVE

## 2020-04-16 LAB — OB RESULTS CONSOLE HEPATITIS B SURFACE ANTIGEN: Hepatitis B Surface Ag: NEGATIVE

## 2020-05-10 NOTE — L&D Delivery Note (Signed)
Patient was C/C/+4 and pushed for <5 minutes with epidural.    NSVD  female infant, Apgars 9,9, weight P.   The patient had a very small first degree laceration of perineum repaired with 2-0 vicryl R.. Fundus was firm. EBL was expected amount. Placenta was delivered intact. Vagina was clear.  Delayed cord clamping done for 30-60 seconds while warming baby. Baby was vigorous and doing skin to skin with mother.  Lacey Sloan

## 2020-05-23 ENCOUNTER — Other Ambulatory Visit: Payer: Self-pay | Admitting: Obstetrics and Gynecology

## 2020-05-23 DIAGNOSIS — Z363 Encounter for antenatal screening for malformations: Secondary | ICD-10-CM

## 2020-06-16 ENCOUNTER — Ambulatory Visit: Payer: No Typology Code available for payment source

## 2020-06-18 ENCOUNTER — Ambulatory Visit: Payer: BLUE CROSS/BLUE SHIELD | Attending: Obstetrics and Gynecology

## 2020-06-18 ENCOUNTER — Other Ambulatory Visit: Payer: Self-pay | Admitting: Obstetrics and Gynecology

## 2020-06-18 ENCOUNTER — Other Ambulatory Visit: Payer: Self-pay

## 2020-06-18 DIAGNOSIS — O321XX0 Maternal care for breech presentation, not applicable or unspecified: Secondary | ICD-10-CM

## 2020-06-18 DIAGNOSIS — Z363 Encounter for antenatal screening for malformations: Secondary | ICD-10-CM | POA: Insufficient documentation

## 2020-06-18 DIAGNOSIS — O99212 Obesity complicating pregnancy, second trimester: Secondary | ICD-10-CM

## 2020-06-18 DIAGNOSIS — Z3A19 19 weeks gestation of pregnancy: Secondary | ICD-10-CM

## 2020-06-19 ENCOUNTER — Other Ambulatory Visit: Payer: Self-pay | Admitting: *Deleted

## 2020-06-19 DIAGNOSIS — R638 Other symptoms and signs concerning food and fluid intake: Secondary | ICD-10-CM

## 2020-07-21 ENCOUNTER — Encounter: Payer: Self-pay | Admitting: *Deleted

## 2020-07-23 ENCOUNTER — Ambulatory Visit: Payer: BLUE CROSS/BLUE SHIELD | Attending: Obstetrics

## 2020-07-23 ENCOUNTER — Ambulatory Visit: Payer: BLUE CROSS/BLUE SHIELD

## 2020-07-31 ENCOUNTER — Telehealth: Payer: Self-pay

## 2020-07-31 NOTE — Telephone Encounter (Signed)
LVM for patient to call office to reschedule follow up ultrasound that was missed on 07/23/20

## 2020-08-19 LAB — OB RESULTS CONSOLE RPR: RPR: NONREACTIVE

## 2020-09-22 NOTE — Patient Instructions (Signed)
Blood work was ordered.     Medications changes include :     Your prescription(s) have been submitted to your pharmacy. Please take as directed and contact our office if you believe you are having problem(s) with the medication(s).   A referral was ordered for        Someone from their office will call you to schedule an appointment.    Please followup in 1 year    Health Maintenance, Female Adopting a healthy lifestyle and getting preventive care are important in promoting health and wellness. Ask your health care provider about:  The right schedule for you to have regular tests and exams.  Things you can do on your own to prevent diseases and keep yourself healthy. What should I know about diet, weight, and exercise? Eat a healthy diet  Eat a diet that includes plenty of vegetables, fruits, low-fat dairy products, and lean protein.  Do not eat a lot of foods that are high in solid fats, added sugars, or sodium.   Maintain a healthy weight Body mass index (BMI) is used to identify weight problems. It estimates body fat based on height and weight. Your health care provider can help determine your BMI and help you achieve or maintain a healthy weight. Get regular exercise Get regular exercise. This is one of the most important things you can do for your health. Most adults should:  Exercise for at least 150 minutes each week. The exercise should increase your heart rate and make you sweat (moderate-intensity exercise).  Do strengthening exercises at least twice a week. This is in addition to the moderate-intensity exercise.  Spend less time sitting. Even light physical activity can be beneficial. Watch cholesterol and blood lipids Have your blood tested for lipids and cholesterol at 32 years of age, then have this test every 5 years. Have your cholesterol levels checked more often if:  Your lipid or cholesterol levels are high.  You are older than 32 years of age.  You  are at high risk for heart disease. What should I know about cancer screening? Depending on your health history and family history, you may need to have cancer screening at various ages. This may include screening for:  Breast cancer.  Cervical cancer.  Colorectal cancer.  Skin cancer.  Lung cancer. What should I know about heart disease, diabetes, and high blood pressure? Blood pressure and heart disease  High blood pressure causes heart disease and increases the risk of stroke. This is more likely to develop in people who have high blood pressure readings, are of African descent, or are overweight.  Have your blood pressure checked: ? Every 3-5 years if you are 18-39 years of age. ? Every year if you are 40 years old or older. Diabetes Have regular diabetes screenings. This checks your fasting blood sugar level. Have the screening done:  Once every three years after age 40 if you are at a normal weight and have a low risk for diabetes.  More often and at a younger age if you are overweight or have a high risk for diabetes. What should I know about preventing infection? Hepatitis B If you have a higher risk for hepatitis B, you should be screened for this virus. Talk with your health care provider to find out if you are at risk for hepatitis B infection. Hepatitis C Testing is recommended for:  Everyone born from 1945 through 1965.  Anyone with known risk factors for hepatitis C. Sexually   transmitted infections (STIs)  Get screened for STIs, including gonorrhea and chlamydia, if: ? You are sexually active and are younger than 32 years of age. ? You are older than 32 years of age and your health care provider tells you that you are at risk for this type of infection. ? Your sexual activity has changed since you were last screened, and you are at increased risk for chlamydia or gonorrhea. Ask your health care provider if you are at risk.  Ask your health care provider about  whether you are at high risk for HIV. Your health care provider may recommend a prescription medicine to help prevent HIV infection. If you choose to take medicine to prevent HIV, you should first get tested for HIV. You should then be tested every 3 months for as long as you are taking the medicine. Pregnancy  If you are about to stop having your period (premenopausal) and you may become pregnant, seek counseling before you get pregnant.  Take 400 to 800 micrograms (mcg) of folic acid every day if you become pregnant.  Ask for birth control (contraception) if you want to prevent pregnancy. Osteoporosis and menopause Osteoporosis is a disease in which the bones lose minerals and strength with aging. This can result in bone fractures. If you are 65 years old or older, or if you are at risk for osteoporosis and fractures, ask your health care provider if you should:  Be screened for bone loss.  Take a calcium or vitamin D supplement to lower your risk of fractures.  Be given hormone replacement therapy (HRT) to treat symptoms of menopause. Follow these instructions at home: Lifestyle  Do not use any products that contain nicotine or tobacco, such as cigarettes, e-cigarettes, and chewing tobacco. If you need help quitting, ask your health care provider.  Do not use street drugs.  Do not share needles.  Ask your health care provider for help if you need support or information about quitting drugs. Alcohol use  Do not drink alcohol if: ? Your health care provider tells you not to drink. ? You are pregnant, may be pregnant, or are planning to become pregnant.  If you drink alcohol: ? Limit how much you use to 0-1 drink a day. ? Limit intake if you are breastfeeding.  Be aware of how much alcohol is in your drink. In the [place] one drink equals one 12 oz bottle of beer (355 mL), one 5 oz glass of wine (148 mL), or one 1 oz glass of hard liquor (44 mL). General instructions  Schedule  regular health, dental, and eye exams.  Stay current with your vaccines.  Tell your health care provider if: ? You often feel depressed. ? You have ever been abused or do not feel safe at home. Summary  Adopting a healthy lifestyle and getting preventive care are important in promoting health and wellness.  Follow your health care provider's instructions about healthy diet, exercising, and getting tested or screened for diseases.  Follow your health care provider's instructions on monitoring your cholesterol and blood pressure. This information is not intended to replace advice given to you by your health care provider. Make sure you discuss any questions you have with your health care provider. Document Revised: 04/19/2018 Document Reviewed: 04/19/2018 Elsevier Patient Education  2021 Elsevier Inc.  

## 2020-09-22 NOTE — Progress Notes (Signed)
Subjective:    Patient ID: Lacey Sloan, female    DOB: 12-Mar-1991, 30 y.o.   MRN: [id number]   This visit occurred during the SARS-CoV-2 public health emergency.  Safety protocols were in place, including screening questions prior to the visit, additional usage of staff PPE, and extensive cleaning of exam room while observing appropriate contact time as indicated for disinfecting solutions.    HPI She is here for a physical exam.     Medications and allergies reviewed with patient and updated if appropriate.  Patient Active Problem List   Diagnosis Date Noted   Right knee pain 08/28/2019   Frequent headaches 08/28/2019   Chronic midline low back pain without sciatica 08/28/2019   Shingles 10/17/2018   Anxiety and depression 10/17/2018   Low grade squamous intraepithelial lesion (LGSIL) on cervicovaginal cytologic smear 10/16/2018    Current Outpatient Medications on File Prior to Visit  Medication Sig Dispense Refill   sertraline (ZOLOFT) 100 MG tablet TAKE 1 TABLET BY MOUTH DAILY 90 tablet 1   SUMAtriptan (IMITREX) 50 MG tablet Take 1 tablet (50 mg total) by mouth every 2 (two) hours as needed for migraine. May repeat in 2 hours if headache persists or recurs. 10 tablet 5   No current facility-administered medications on file prior to visit.    Past Medical History:  Diagnosis Date   History of psoriasis    Hx of varicella    Vaginal Pap smear, abnormal     Past Surgical History:  Procedure Laterality Date   DILATION AND CURETTAGE OF UTERUS      Social History   Socioeconomic History   Marital status: Married    Spouse name: Not on file   Number of children: Not on file   Years of education: Not on file   Highest education level: Not on file  Occupational History   Not on file  Tobacco Use   Smoking status: Former Smoker   Smokeless tobacco: Former Neurosurgeon  Substance and Sexual Activity   Alcohol use: Yes    Comment: socially    Drug use: Never    Sexual activity: Not on file  Other Topics Concern   Not on file  Social History Narrative   Married, 101 year old son   Works at [employer]   No regular exercise   Social Determinants of Corporate investment banker Strain: Not on file  Food Insecurity: Not on file  Transportation Needs: Not on file  Physical Activity: Not on file  Stress: Not on file  Social Connections: Not on file    Family History  Problem Relation Age of Onset   Diabetes Maternal Grandmother    Heart disease Paternal Grandfather    Cancer Paternal Grandfather     Review of Systems     Objective:  There were no vitals filed for this visit. There were no vitals filed for this visit. There is no height or weight on file to calculate BMI.  BP Readings from Last 3 Encounters:  08/30/19 100/60  08/28/19 118/68  10/17/18 112/68    Wt Readings from Last 3 Encounters:  08/30/19 214 lb 6.4 oz (97.3 kg)  08/28/19 211 lb (95.7 kg)  10/17/18 250 lb 12.8 oz (113.8 kg)     Physical Exam Constitutional: She appears well-developed and well-nourished. No distress.  HENT:  Head: Normocephalic and atraumatic.  Right Ear: External ear normal. Normal ear canal and TM Left Ear: External ear normal.  Normal  ear canal and TM Mouth/Throat: Oropharynx is clear and moist.  Eyes: Conjunctivae and EOM are normal.  Neck: Neck supple. No tracheal deviation present. No thyromegaly present.  No carotid bruit  Cardiovascular: Normal rate, regular rhythm and normal heart sounds.   No murmur heard.  No edema. Pulmonary/Chest: Effort normal and breath sounds normal. No respiratory distress. She has no wheezes. She has no rales.  Breast: deferred   Abdominal: Soft. She exhibits no distension. There is no tenderness.  Lymphadenopathy: She has no cervical adenopathy.  Skin: Skin is warm and dry. She is not diaphoretic.  Psychiatric: She has a normal mood and affect. Her behavior is normal.        Assessment & Plan:    Physical exam: Screening blood work    ordered Immunizations  ? covid Gyn  Up to date  Exercise  Walks a lot with work, walking dogs Weight  Currently pregnant Substance abuse  none      See Problem List for Assessment and Plan of chronic medical problems.     This encounter was created in error - please disregard.

## 2020-09-23 ENCOUNTER — Encounter: Payer: BLUE CROSS/BLUE SHIELD | Admitting: Internal Medicine

## 2020-09-23 DIAGNOSIS — Z0000 Encounter for general adult medical examination without abnormal findings: Secondary | ICD-10-CM

## 2020-09-23 DIAGNOSIS — F32A Depression, unspecified: Secondary | ICD-10-CM

## 2020-09-29 NOTE — Patient Instructions (Addendum)
Medications changes include :  none   Your prescription(s) have been submitted to your pharmacy. Please take as directed and contact our office if you believe you are having problem(s) with the medication(s).    Please followup in 1 year     Health Maintenance, Female Adopting a healthy lifestyle and getting preventive care are important in promoting health and wellness. Ask your health care provider about:  The right schedule for you to have regular tests and exams.  Things you can do on your own to prevent diseases and keep yourself healthy. What should I know about diet, weight, and exercise? Eat a healthy diet  Eat a diet that includes plenty of vegetables, fruits, low-fat dairy products, and lean protein.  Do not eat a lot of foods that are high in solid fats, added sugars, or sodium.   Maintain a healthy weight Body mass index (BMI) is used to identify weight problems. It estimates body fat based on height and weight. Your health care provider can help determine your BMI and help you achieve or maintain a healthy weight. Get regular exercise Get regular exercise. This is one of the most important things you can do for your health. Most adults should:  Exercise for at least 150 minutes each week. The exercise should increase your heart rate and make you sweat (moderate-intensity exercise).  Do strengthening exercises at least twice a week. This is in addition to the moderate-intensity exercise.  Spend less time sitting. Even light physical activity can be beneficial. Watch cholesterol and blood lipids Have your blood tested for lipids and cholesterol at 32 years of age, then have this test every 5 years. Have your cholesterol levels checked more often if:  Your lipid or cholesterol levels are high.  You are older than 32 years of age.  You are at high risk for heart disease. What should I know about cancer screening? Depending on your health history and family  history, you may need to have cancer screening at various ages. This may include screening for:  Breast cancer.  Cervical cancer.  Colorectal cancer.  Skin cancer.  Lung cancer. What should I know about heart disease, diabetes, and high blood pressure? Blood pressure and heart disease  High blood pressure causes heart disease and increases the risk of stroke. This is more likely to develop in people who have high blood pressure readings, are of African descent, or are overweight.  Have your blood pressure checked: ? Every 3-5 years if you are 61-34 years of age. ? Every year if you are 27 years old or older. Diabetes Have regular diabetes screenings. This checks your fasting blood sugar level. Have the screening done:  Once every three years after age 71 if you are at a normal weight and have a low risk for diabetes.  More often and at a younger age if you are overweight or have a high risk for diabetes. What should I know about preventing infection? Hepatitis B If you have a higher risk for hepatitis B, you should be screened for this virus. Talk with your health care provider to find out if you are at risk for hepatitis B infection. Hepatitis C Testing is recommended for:  Everyone born from [birth year] through 1965.  Anyone with known risk factors for hepatitis C. Sexually transmitted infections (STIs)  Get screened for STIs, including gonorrhea and chlamydia, if: ? You are sexually active and are younger than 32 years of age. ? You are older than  32 years of age and your health care provider tells you that you are at risk for this type of infection. ? Your sexual activity has changed since you were last screened, and you are at increased risk for chlamydia or gonorrhea. Ask your health care provider if you are at risk.  Ask your health care provider about whether you are at high risk for HIV. Your health care provider may recommend a prescription medicine to help prevent HIV  infection. If you choose to take medicine to prevent HIV, you should first get tested for HIV. You should then be tested every 3 months for as long as you are taking the medicine. Pregnancy  If you are about to stop having your period (premenopausal) and you may become pregnant, seek counseling before you get pregnant.  Take 400 to 800 micrograms (mcg) of folic acid every day if you become pregnant.  Ask for birth control (contraception) if you want to prevent pregnancy. Osteoporosis and menopause Osteoporosis is a disease in which the bones lose minerals and strength with aging. This can result in bone fractures. If you are 42 years old or older, or if you are at risk for osteoporosis and fractures, ask your health care provider if you should:  Be screened for bone loss.  Take a calcium or vitamin D supplement to lower your risk of fractures.  Be given hormone replacement therapy (HRT) to treat symptoms of menopause. Follow these instructions at home: Lifestyle  Do not use any products that contain nicotine or tobacco, such as cigarettes, e-cigarettes, and chewing tobacco. If you need help quitting, ask your health care provider.  Do not use street drugs.  Do not share needles.  Ask your health care provider for help if you need support or information about quitting drugs. Alcohol use  Do not drink alcohol if: ? Your health care provider tells you not to drink. ? You are pregnant, may be pregnant, or are planning to become pregnant.  If you drink alcohol: ? Limit how much you use to 0-1 drink a day. ? Limit intake if you are breastfeeding.  Be aware of how much alcohol is in your drink. In the [place] one drink equals one 12 oz bottle of beer (355 mL), one 5 oz glass of wine (148 mL), or one 1 oz glass of hard liquor (44 mL). General instructions  Schedule regular health, dental, and eye exams.  Stay current with your vaccines.  Tell your health care provider if: ? You  often feel depressed. ? You have ever been abused or do not feel safe at home. Summary  Adopting a healthy lifestyle and getting preventive care are important in promoting health and wellness.  Follow your health care provider's instructions about healthy diet, exercising, and getting tested or screened for diseases.  Follow your health care provider's instructions on monitoring your cholesterol and blood pressure. This information is not intended to replace advice given to you by your health care provider. Make sure you discuss any questions you have with your health care provider. Document Revised: 04/19/2018 Document Reviewed: 04/19/2018 Elsevier Patient Education  2021 [employer].

## 2020-09-29 NOTE — Progress Notes (Signed)
Subjective:    Patient ID: Lacey Sloan, female    DOB: [birth year], 30 y.o.   MRN: [id number]   This visit occurred during the SARS-CoV-2 public health emergency.  Safety protocols were in place, including screening questions prior to the visit, additional usage of staff PPE, and extensive cleaning of exam room while observing appropriate contact time as indicated for disinfecting solutions.    HPI She is here for a physical exam.    She is [redacted] weeks pregnant and it is a boy.  Pregnancy has been good and she denies any complications.  She did have some nausea initially and has had regular heartburn.  She is taking the sertraline and feels her depression and anxiety are well controlled.  She has noticed a little bit of swelling in her hands today, but has not seen that previously.  She denies leg swelling.  She has no concerns.    Medications and allergies reviewed with patient and updated if appropriate.  Patient Active Problem List   Diagnosis Date Noted  . Right knee pain 08/28/2019  . Migraine 08/28/2019  . Chronic midline low back pain without sciatica 08/28/2019  . Shingles 10/17/2018  . Anxiety and depression 10/17/2018  . Low grade squamous intraepithelial lesion (LGSIL) on cervicovaginal cytologic smear 10/16/2018    Current Outpatient Medications on File Prior to Visit  Medication Sig Dispense Refill  . sertraline (ZOLOFT) 100 MG tablet TAKE 1 TABLET BY MOUTH DAILY 90 tablet 1  . SUMAtriptan (IMITREX) 50 MG tablet Take 1 tablet (50 mg total) by mouth every 2 (two) hours as needed for migraine. May repeat in 2 hours if headache persists or recurs. (Patient not taking: Reported on 09/30/2020) 10 tablet 5   No current facility-administered medications on file prior to visit.    Past Medical History:  Diagnosis Date  . History of psoriasis   . Hx of varicella   . Vaginal Pap smear, abnormal     Past Surgical History:  Procedure Laterality Date  . DILATION AND  CURETTAGE OF UTERUS      Social History   Socioeconomic History  . Marital status: Married    Spouse name: Not on file  . Number of children: Not on file  . Years of education: Not on file  . Highest education level: Not on file  Occupational History  . Not on file  Tobacco Use  . Smoking status: Former Games developer  . Smokeless tobacco: Former Engineer, water and Sexual Activity  . Alcohol use: Yes    Comment: socially   . Drug use: Never  . Sexual activity: Not on file  Other Topics Concern  . Not on file  Social History Narrative   Married, 51 year old son   Works at [employer]   No regular exercise   Social Determinants of Corporate investment banker Strain: Not on file  Food Insecurity: Not on file  Transportation Needs: Not on file  Physical Activity: Not on file  Stress: Not on file  Social Connections: Not on file    Family History  Problem Relation Age of Onset  . Diabetes Maternal Grandmother   . Heart disease Paternal Grandfather   . Cancer Paternal Grandfather     Review of Systems  Constitutional: Negative for chills and fever.  HENT: Positive for congestion, postnasal drip and sore throat (scratchy only). Negative for sinus pressure and sinus pain.   Eyes: Negative for visual disturbance.  Respiratory: Positive  for cough (dry cough x 2 weeks), shortness of breath (pregnancy related) and wheezing (some). Negative for chest tightness.   Cardiovascular: Positive for leg swelling (just in the past day or so). Negative for chest pain and palpitations.  Gastrointestinal: Negative for abdominal pain, constipation, diarrhea and nausea.       Gerd   Genitourinary: Negative for dysuria.  Musculoskeletal: Positive for arthralgias (mild knee from weight loss) and back pain (baby related).  Skin: Negative for rash.  Neurological: Positive for headaches (occ - better). Negative for light-headedness.  Psychiatric/Behavioral: Positive for dysphoric mood (controlled).  The patient is nervous/anxious (controlled).        Objective:   Vitals:   09/30/20 1458  BP: 108/70  Pulse: 97  Temp: 97.9 F (36.6 C)  SpO2: 98%   Filed Weights   09/30/20 1458  Weight: (!) 310 lb (140.6 kg)   Body mass index is 51.59 kg/m.  BP Readings from Last 3 Encounters:  09/30/20 108/70  08/30/19 100/60  08/28/19 118/68    Wt Readings from Last 3 Encounters:  09/30/20 (!) 310 lb (140.6 kg)  08/30/19 214 lb 6.4 oz (97.3 kg)  08/28/19 211 lb (95.7 kg)     Physical Exam Constitutional: She appears well-developed and well-nourished. No distress.  HENT:  Head: Normocephalic and atraumatic.  Right Ear: External ear normal. Normal ear canal and TM Left Ear: External ear normal.  Normal ear canal and TM Mouth/Throat: Oropharynx is clear and moist.  Eyes: Conjunctivae and EOM are normal.  Neck: Neck supple. No tracheal deviation present. No thyromegaly present.  No carotid bruit  Cardiovascular: Normal rate, regular rhythm and normal heart sounds.   No murmur heard.  No edema. Pulmonary/Chest: Effort normal and breath sounds normal. No respiratory distress. She has no wheezes. She has no rales.  Breast: deferred   Abdominal: Soft.  Pregnant.  There is no tenderness.  Lymphadenopathy: She has no cervical adenopathy.  Skin: Skin is warm and dry. She is not diaphoretic.  Psychiatric: She has a normal mood and affect. Her behavior is normal.        Assessment & Plan:   Physical exam: Screening blood work    ordered Immunizations  had Covid, others up to date Gyn  Up to date  Exercise  Walks some-last now that her pregnancy is getting toward the end Weight  Currently pregnant Substance abuse  none      See Problem List for Assessment and Plan of chronic medical problems.

## 2020-09-30 ENCOUNTER — Ambulatory Visit (INDEPENDENT_AMBULATORY_CARE_PROVIDER_SITE_OTHER): Payer: BLUE CROSS/BLUE SHIELD | Admitting: Internal Medicine

## 2020-09-30 ENCOUNTER — Other Ambulatory Visit: Payer: Self-pay

## 2020-09-30 ENCOUNTER — Encounter: Payer: Self-pay | Admitting: Internal Medicine

## 2020-09-30 VITALS — BP 108/70 | HR 97 | Temp 97.9°F | Ht 65.0 in | Wt 310.0 lb

## 2020-09-30 DIAGNOSIS — F32A Depression, unspecified: Secondary | ICD-10-CM

## 2020-09-30 DIAGNOSIS — F419 Anxiety disorder, unspecified: Secondary | ICD-10-CM

## 2020-09-30 DIAGNOSIS — Z0000 Encounter for general adult medical examination without abnormal findings: Secondary | ICD-10-CM | POA: Diagnosis not present

## 2020-09-30 DIAGNOSIS — G43809 Other migraine, not intractable, without status migrainosus: Secondary | ICD-10-CM

## 2020-09-30 DIAGNOSIS — R519 Headache, unspecified: Secondary | ICD-10-CM

## 2020-09-30 MED ORDER — SERTRALINE HCL 100 MG PO TABS: 1.0000 | ORAL_TABLET | Freq: Every day | ORAL | 1 refills | Status: DC

## 2020-09-30 NOTE — Assessment & Plan Note (Signed)
Chronic Migraines better during pregnancy She did take Imitrex prior to being pregnant and it did help-can restart after pregnancy

## 2020-09-30 NOTE — Assessment & Plan Note (Signed)
Chronic Controlled and stable Continue sertraline 100 mg daily

## 2020-10-15 LAB — OB RESULTS CONSOLE GBS: GBS: POSITIVE

## 2020-11-03 ENCOUNTER — Inpatient Hospital Stay (HOSPITAL_COMMUNITY): Payer: BLUE CROSS/BLUE SHIELD | Admitting: Anesthesiology

## 2020-11-03 ENCOUNTER — Inpatient Hospital Stay (HOSPITAL_COMMUNITY)
Admission: AD | Admit: 2020-11-03 | Discharge: 2020-11-04 | DRG: 807 | Disposition: A | Discharge status: Home / Self Care | Payer: BLUE CROSS/BLUE SHIELD | Attending: Obstetrics and Gynecology | Admitting: Obstetrics and Gynecology

## 2020-11-03 ENCOUNTER — Other Ambulatory Visit: Payer: Self-pay

## 2020-11-03 ENCOUNTER — Encounter (HOSPITAL_COMMUNITY): Payer: Self-pay | Admitting: Obstetrics and Gynecology

## 2020-11-03 DIAGNOSIS — Z3A39 39 weeks gestation of pregnancy: Secondary | ICD-10-CM

## 2020-11-03 DIAGNOSIS — Z20822 Contact with and (suspected) exposure to covid-19: Secondary | ICD-10-CM | POA: Diagnosis present

## 2020-11-03 DIAGNOSIS — O99824 Streptococcus B carrier state complicating childbirth: Secondary | ICD-10-CM | POA: Diagnosis present

## 2020-11-03 DIAGNOSIS — Z87891 Personal history of nicotine dependence: Secondary | ICD-10-CM

## 2020-11-03 DIAGNOSIS — O26893 Other specified pregnancy related conditions, third trimester: Secondary | ICD-10-CM | POA: Diagnosis present

## 2020-11-03 HISTORY — DX: Personal history of other infectious and parasitic diseases: Z86.19

## 2020-11-03 LAB — RESP PANEL BY RT-PCR (FLU A&B, COVID) ARPGX2
Influenza A by PCR: NEGATIVE
Influenza B by PCR: NEGATIVE
SARS Coronavirus 2 by RT PCR: NEGATIVE

## 2020-11-03 LAB — CBC
HCT: 33.9 % — ABNORMAL LOW (ref 36.0–46.0)
Hemoglobin: 11 g/dL — ABNORMAL LOW (ref 12.0–15.0)
MCH: 29.6 pg (ref 26.0–34.0)
MCHC: 32.4 g/dL (ref 30.0–36.0)
MCV: 91.1 fL (ref 80.0–100.0)
Platelets: 287 10*3/uL (ref 150–400)
RBC: 3.72 MIL/uL — ABNORMAL LOW (ref 3.87–5.11)
RDW: 13.3 % (ref 11.5–15.5)
WBC: 11.3 10*3/uL — ABNORMAL HIGH (ref 4.0–10.5)
nRBC: 0 % (ref 0.0–0.2)

## 2020-11-03 LAB — POCT FERN TEST: POCT Fern Test: POSITIVE

## 2020-11-03 LAB — TYPE AND SCREEN
ABO/RH(D): A POS
Antibody Screen: NEGATIVE

## 2020-11-03 LAB — RPR: RPR Ser Ql: NONREACTIVE

## 2020-11-03 MED ORDER — ONDANSETRON HCL 4 MG/2ML IJ SOLN: 4.0000 mg | INTRAMUSCULAR | Status: DC | PRN

## 2020-11-03 MED ORDER — WITCH HAZEL-GLYCERIN EX PADS: 1.0000 "application " | MEDICATED_PAD | CUTANEOUS | Status: DC | PRN

## 2020-11-03 MED ORDER — SENNOSIDES-DOCUSATE SODIUM 8.6-50 MG PO TABS
2.0000 | ORAL_TABLET | Freq: Every day | ORAL | Status: DC
  Administered 2020-11-04: 2 via ORAL
  Filled 2020-11-03: qty 2

## 2020-11-03 MED ORDER — PRENATAL MULTIVITAMIN CH
1.0000 | ORAL_TABLET | Freq: Every day | ORAL | Status: DC
  Administered 2020-11-04: 1 via ORAL
  Filled 2020-11-03: qty 1

## 2020-11-03 MED ORDER — FENTANYL CITRATE (PF) 100 MCG/2ML IJ SOLN
50.0000 ug | INTRAMUSCULAR | Status: DC | PRN
  Administered 2020-11-03: 50 ug via INTRAVENOUS
  Filled 2020-11-03: qty 2

## 2020-11-03 MED ORDER — SIMETHICONE 80 MG PO CHEW: 80.0000 mg | CHEWABLE_TABLET | ORAL | Status: DC | PRN

## 2020-11-03 MED ORDER — SODIUM CHLORIDE 0.9 % IV SOLN
5.0000 10*6.[IU] | Freq: Once | INTRAVENOUS | Status: AC
  Administered 2020-11-03: 5 10*6.[IU] via INTRAVENOUS
  Filled 2020-11-03: qty 5

## 2020-11-03 MED ORDER — DIPHENHYDRAMINE HCL 25 MG PO CAPS: 25.0000 mg | ORAL_CAPSULE | Freq: Four times a day (QID) | ORAL | Status: DC | PRN

## 2020-11-03 MED ORDER — LACTATED RINGERS IV SOLN: INTRAVENOUS | Status: DC

## 2020-11-03 MED ORDER — BENZOCAINE-MENTHOL 20-0.5 % EX AERO
1.0000 "application " | INHALATION_SPRAY | CUTANEOUS | Status: DC | PRN
  Administered 2020-11-03: 1 via TOPICAL
  Filled 2020-11-03: qty 56

## 2020-11-03 MED ORDER — EPHEDRINE 5 MG/ML INJ: 10.0000 mg | INTRAVENOUS | Status: DC | PRN

## 2020-11-03 MED ORDER — FLEET ENEMA 7-19 GM/118ML RE ENEM: 1.0000 | ENEMA | RECTAL | Status: DC | PRN

## 2020-11-03 MED ORDER — ZOLPIDEM TARTRATE 5 MG PO TABS: 5.0000 mg | ORAL_TABLET | Freq: Every evening | ORAL | Status: DC | PRN

## 2020-11-03 MED ORDER — SOD CITRATE-CITRIC ACID 500-334 MG/5ML PO SOLN: 30.0000 mL | ORAL | Status: DC | PRN

## 2020-11-03 MED ORDER — IBUPROFEN 800 MG PO TABS
800.0000 mg | ORAL_TABLET | Freq: Three times a day (TID) | ORAL | Status: DC
  Administered 2020-11-03 – 2020-11-04 (×2): 800 mg via ORAL
  Filled 2020-11-03 (×2): qty 1

## 2020-11-03 MED ORDER — METHYLERGONOVINE MALEATE 0.2 MG/ML IJ SOLN: 0.2000 mg | INTRAMUSCULAR | Status: DC | PRN

## 2020-11-03 MED ORDER — LACTATED RINGERS IV SOLN: 500.0000 mL | INTRAVENOUS | Status: DC | PRN

## 2020-11-03 MED ORDER — LACTATED RINGERS IV SOLN
500.0000 mL | Freq: Once | INTRAVENOUS | Status: AC
  Administered 2020-11-03: 500 mL via INTRAVENOUS

## 2020-11-03 MED ORDER — DIBUCAINE (PERIANAL) 1 % EX OINT: 1.0000 "application " | TOPICAL_OINTMENT | CUTANEOUS | Status: DC | PRN

## 2020-11-03 MED ORDER — FENTANYL-BUPIVACAINE-NACL 0.5-0.125-0.9 MG/250ML-% EP SOLN
12.0000 mL/h | EPIDURAL | Status: DC | PRN
  Administered 2020-11-03: 12 mL/h via EPIDURAL
  Filled 2020-11-03: qty 250

## 2020-11-03 MED ORDER — OXYCODONE-ACETAMINOPHEN 5-325 MG PO TABS: 2.0000 | ORAL_TABLET | ORAL | Status: DC | PRN

## 2020-11-03 MED ORDER — SODIUM CHLORIDE 0.9% FLUSH: 3.0000 mL | INTRAVENOUS | Status: DC | PRN

## 2020-11-03 MED ORDER — LACTATED RINGERS IV SOLN: 500.0000 mL | Freq: Once | INTRAVENOUS | Status: DC

## 2020-11-03 MED ORDER — PENICILLIN G POT IN DEXTROSE 60000 UNIT/ML IV SOLN
3.0000 10*6.[IU] | INTRAVENOUS | Status: DC
  Administered 2020-11-03 (×2): 3 10*6.[IU] via INTRAVENOUS
  Filled 2020-11-03 (×2): qty 50

## 2020-11-03 MED ORDER — ONDANSETRON HCL 4 MG PO TABS: 4.0000 mg | ORAL_TABLET | ORAL | Status: DC | PRN

## 2020-11-03 MED ORDER — LIDOCAINE HCL (PF) 1 % IJ SOLN: 30.0000 mL | INTRAMUSCULAR | Status: DC | PRN

## 2020-11-03 MED ORDER — COCONUT OIL OIL: 1.0000 "application " | TOPICAL_OIL | Status: DC | PRN

## 2020-11-03 MED ORDER — ACETAMINOPHEN 325 MG PO TABS: 650.0000 mg | ORAL_TABLET | ORAL | Status: DC | PRN

## 2020-11-03 MED ORDER — MEASLES, MUMPS & RUBELLA VAC IJ SOLR: 0.5000 mL | Freq: Once | INTRAMUSCULAR | Status: DC

## 2020-11-03 MED ORDER — SERTRALINE HCL 100 MG PO TABS
100.0000 mg | ORAL_TABLET | Freq: Every day | ORAL | Status: DC
  Administered 2020-11-03 – 2020-11-04 (×2): 100 mg via ORAL
  Filled 2020-11-03 (×2): qty 1

## 2020-11-03 MED ORDER — LIDOCAINE HCL (PF) 1 % IJ SOLN
INTRAMUSCULAR | Status: DC | PRN
  Administered 2020-11-03 (×2): 5 mL via EPIDURAL

## 2020-11-03 MED ORDER — OXYTOCIN-SODIUM CHLORIDE 30-0.9 UT/500ML-% IV SOLN
2.5000 [IU]/h | INTRAVENOUS | Status: DC
  Administered 2020-11-03: 2.5 [IU]/h via INTRAVENOUS
  Filled 2020-11-03: qty 500

## 2020-11-03 MED ORDER — SODIUM CHLORIDE 0.9% FLUSH: 3.0000 mL | Freq: Two times a day (BID) | INTRAVENOUS | Status: DC

## 2020-11-03 MED ORDER — ACETAMINOPHEN 325 MG PO TABS
650.0000 mg | ORAL_TABLET | ORAL | Status: DC | PRN
  Administered 2020-11-03 – 2020-11-04 (×2): 650 mg via ORAL
  Filled 2020-11-03 (×3): qty 2

## 2020-11-03 MED ORDER — ONDANSETRON HCL 4 MG/2ML IJ SOLN: 4.0000 mg | Freq: Four times a day (QID) | INTRAMUSCULAR | Status: DC | PRN

## 2020-11-03 MED ORDER — SODIUM CHLORIDE 0.9 % IV SOLN: 250.0000 mL | INTRAVENOUS | Status: DC | PRN

## 2020-11-03 MED ORDER — TETANUS-DIPHTH-ACELL PERTUSSIS 5-2.5-18.5 LF-MCG/0.5 IM SUSY: 0.5000 mL | PREFILLED_SYRINGE | Freq: Once | INTRAMUSCULAR | Status: DC

## 2020-11-03 MED ORDER — PHENYLEPHRINE 40 MCG/ML (10ML) SYRINGE FOR IV PUSH (FOR BLOOD PRESSURE SUPPORT)
80.0000 ug | PREFILLED_SYRINGE | INTRAVENOUS | Status: DC | PRN
  Administered 2020-11-03 (×2): 80 ug via INTRAVENOUS

## 2020-11-03 MED ORDER — OXYTOCIN BOLUS FROM INFUSION
333.0000 mL | Freq: Once | INTRAVENOUS | Status: AC
  Administered 2020-11-03: 333 mL via INTRAVENOUS

## 2020-11-03 MED ORDER — DIPHENHYDRAMINE HCL 50 MG/ML IJ SOLN: 12.5000 mg | INTRAMUSCULAR | Status: DC | PRN

## 2020-11-03 MED ORDER — PHENYLEPHRINE 40 MCG/ML (10ML) SYRINGE FOR IV PUSH (FOR BLOOD PRESSURE SUPPORT)
80.0000 ug | PREFILLED_SYRINGE | INTRAVENOUS | Status: DC | PRN
  Filled 2020-11-03: qty 10

## 2020-11-03 MED ORDER — FERROUS SULFATE 325 (65 FE) MG PO TABS
325.0000 mg | ORAL_TABLET | Freq: Two times a day (BID) | ORAL | Status: DC
  Administered 2020-11-04: 325 mg via ORAL
  Filled 2020-11-03 (×2): qty 1

## 2020-11-03 MED ORDER — MAGNESIUM HYDROXIDE 400 MG/5ML PO SUSP: 30.0000 mL | ORAL | Status: DC | PRN

## 2020-11-03 MED ORDER — SUMATRIPTAN SUCCINATE 50 MG PO TABS
50.0000 mg | ORAL_TABLET | ORAL | Status: DC | PRN
  Filled 2020-11-03: qty 1

## 2020-11-03 MED ORDER — OXYCODONE-ACETAMINOPHEN 5-325 MG PO TABS: 1.0000 | ORAL_TABLET | ORAL | Status: DC | PRN

## 2020-11-03 MED ORDER — METHYLERGONOVINE MALEATE 0.2 MG PO TABS
0.2000 mg | ORAL_TABLET | ORAL | Status: DC | PRN
Start: 2020-11-03 — End: 2020-11-03

## 2020-11-03 NOTE — MAU Note (Signed)
Pt reports water broke around midnight. Clear fluid coming out. C/o ctx q 2 min. Good fetal movement . 2cm last office visit.

## 2020-11-03 NOTE — H&P (Signed)
30 y.o. [redacted]w[redacted]d  G3P1011 comes in c/o SROM at [place].  Otherwise has good fetal movement and no bleeding.  Past Medical History:  Diagnosis Date   History of psoriasis    History of shingles    Hx of varicella    Vaginal Pap smear, abnormal     Past Surgical History:  Procedure Laterality Date   DILATION AND CURETTAGE OF UTERUS      OB History  Gravida Para Term Preterm AB Living  3 1 1   1 1   SAB IAB Ectopic Multiple Live Births    1   0 1    # Outcome Date GA Lbr Len/2nd Weight Sex Delivery Anes PTL Lv  3 Current           2 Term 10/20/16 [redacted]w[redacted]d / 00:57 4100 g M Vag-Spont EPI  LIV  1 IAB             Social History   Socioeconomic History   Marital status: Married    Spouse name: Not on file   Number of children: Not on file   Years of education: Not on file   Highest education level: Not on file  Occupational History   Not on file  Tobacco Use   Smoking status: Former    Pack years: 0.00   Smokeless tobacco: Former    Quit date: 02/2020  Vaping Use   Vaping Use: Former   Quit date: 03/08/2020  Substance and Sexual Activity   Alcohol use: Not Currently    Comment: socially    Drug use: Never   Sexual activity: Not on file  Other Topics Concern   Not on file  Social History Narrative   Married, 64 year old son   Works at 10   No regular exercise   Social Determinants of [employer] Strain: Not on file  Food Insecurity: Not on file  Transportation Needs: Not on file  Physical Activity: Not on file  Stress: Not on file  Social Connections: Not on file  Intimate Partner Violence: Not on file   Patient has no known allergies.    Prenatal Transfer Tool  Maternal Diabetes: No Genetic Screening: Normal Maternal Ultrasounds/Referrals: Normal Fetal Ultrasounds or other Referrals:  None Maternal Substance Abuse:  No Significant Maternal Medications:  None Significant Maternal Lab Results: Group B Strep positive  Other PNC:  uncomplicated.  Last Corporate investment banker 7#13, 68%ile. AFI 18. VTX. BPP 8/8.    Vitals:   11/03/20 0531 11/03/20 0601 11/03/20 0630 11/03/20 0701  BP: 126/70 (!) 97/51 (!) 102/57 (!) 102/49  Pulse: 76 82 84 78  Resp: 18 18 18 18   Temp:   98.5 F (36.9 C)   TempSrc:   Oral   SpO2:   95%   Weight:      Height:        Lungs/Cor:  NAD Abdomen:  soft, gravid Ex:  no cords, erythema SVE:  now 5/90/-2 FHTs:  120s, good STV, NST R; Cat 1 tracing. Toco:  q 3-4   A/P   Term labor.  GBS POS- on Pen G.   

## 2020-11-03 NOTE — Progress Notes (Signed)
Patient was assisted to the bathroom by stedy because left leg still tingling from epidural. Assisted patient with pulling her underwear down. Patients left leg became weak and she slowly lowered her left knee to the floor. RN and husband assisted patient onto the toilet. Then took patient back to bed by stedy. Dr. Henderson Cloud notified. Will notify Anesthesia if patient still weak in left leg in one hour. No other orders

## 2020-11-03 NOTE — Anesthesia Procedure Notes (Signed)
Epidural Patient location during procedure: OB Start time: 11/03/2020 4:25 AM End time: 11/03/2020 4:31 AM  Staffing Anesthesiologist: Achille Rich, MD Performed: anesthesiologist   Preanesthetic Checklist Completed: patient identified, IV checked, site marked, risks and benefits discussed, monitors and equipment checked, pre-op evaluation and timeout performed  Epidural Patient position: sitting Prep: DuraPrep Patient monitoring: heart rate, cardiac monitor, continuous pulse ox and blood pressure Approach: midline Location: L2-L3 Injection technique: LOR saline  Needle:  Needle type: Tuohy  Needle gauge: 17 G Needle length: 9 cm Needle insertion depth: 7 cm Catheter type: closed end flexible Catheter size: 19 Gauge Catheter at skin depth: 12 cm Test dose: negative and Other  Assessment Events: blood not aspirated, injection not painful, no injection resistance and negative IV test  Additional Notes Informed consent obtained prior to proceeding including risk of failure, 1% risk of PDPH, risk of minor discomfort and bruising.  Discussed rare but serious complications including epidural abscess, permanent nerve injury, epidural hematoma.  Discussed alternatives to epidural analgesia and patient desires to proceed.  Timeout performed pre-procedure verifying patient name, procedure, and platelet count.  Patient tolerated procedure well. Reason for block:procedure for pain

## 2020-11-03 NOTE — Plan of Care (Signed)

## 2020-11-03 NOTE — Anesthesia Preprocedure Evaluation (Signed)
Anesthesia Evaluation  Patient identified by MRN, date of birth, ID band Patient awake    Reviewed: Allergy & Precautions, H&P , NPO status , Patient's Chart, lab work & pertinent test results  Airway Mallampati: II   Neck ROM: full    Dental   Pulmonary former smoker,    breath sounds clear to auscultation       Cardiovascular negative cardio ROS   Rhythm:regular Rate:Normal     Neuro/Psych  Headaches, PSYCHIATRIC DISORDERS Anxiety Depression    GI/Hepatic   Endo/Other  Morbid obesity  Renal/GU      Musculoskeletal   Abdominal   Peds  Hematology   Anesthesia Other Findings   Reproductive/Obstetrics (+) Pregnancy                             Anesthesia Physical Anesthesia Plan  ASA: 2  Anesthesia Plan: Epidural   Post-op Pain Management:    Induction: Intravenous  PONV Risk Score and Plan: 2 and Treatment may vary due to age or medical condition  Airway Management Planned: Natural Airway  Additional Equipment:   Intra-op Plan:   Post-operative Plan:   Informed Consent: I have reviewed the patients History and Physical, chart, labs and discussed the procedure including the risks, benefits and alternatives for the proposed anesthesia with the patient or authorized representative who has indicated his/her understanding and acceptance.       Plan Discussed with: Anesthesiologist  Anesthesia Plan Comments:         Anesthesia Quick Evaluation

## 2020-11-04 LAB — CBC
HCT: 32.3 % — ABNORMAL LOW (ref 36.0–46.0)
Hemoglobin: 10.5 g/dL — ABNORMAL LOW (ref 12.0–15.0)
MCH: 29.3 pg (ref 26.0–34.0)
MCHC: 32.5 g/dL (ref 30.0–36.0)
MCV: 90.2 fL (ref 80.0–100.0)
Platelets: 254 10*3/uL (ref 150–400)
RBC: 3.58 MIL/uL — ABNORMAL LOW (ref 3.87–5.11)
RDW: 13.2 % (ref 11.5–15.5)
WBC: 11 10*3/uL — ABNORMAL HIGH (ref 4.0–10.5)
nRBC: 0 % (ref 0.0–0.2)

## 2020-11-04 LAB — BIRTH TISSUE RECOVERY COLLECTION (PLACENTA DONATION)

## 2020-11-04 MED ORDER — ACETAMINOPHEN 325 MG PO TABS: 650.0000 mg | ORAL_TABLET | ORAL | 1 refills | Status: DC | PRN

## 2020-11-04 MED ORDER — IBUPROFEN 800 MG PO TABS: 800.0000 mg | ORAL_TABLET | Freq: Three times a day (TID) | ORAL | 1 refills | Status: DC

## 2020-11-04 NOTE — Anesthesia Postprocedure Evaluation (Signed)
Anesthesia Post Note  Patient: Lacey Sloan  Procedure(s) Performed: AN AD HOC LABOR EPIDURAL     Patient location during evaluation: Mother Baby Anesthesia Type: Epidural Level of consciousness: awake and alert Pain management: pain level controlled Vital Signs Assessment: post-procedure vital signs reviewed and stable Respiratory status: spontaneous breathing, nonlabored ventilation and respiratory function stable Cardiovascular status: stable Postop Assessment: no headache, no backache, epidural receding, no apparent nausea or vomiting, patient able to bend at knees, adequate PO intake and able to ambulate Anesthetic complications: no   No notable events documented.  Last Vitals:  Vitals:   11/04/20 0000 11/04/20 0500  BP: 105/60 (!) 108/56  Pulse: 70 68  Resp: 16 16  Temp: 36.6 C 36.6 C  SpO2: 97% 98%    Last Pain:  Vitals:   11/04/20 0558  TempSrc:   PainSc: 0-No pain   Pain Goal:                   [place]

## 2020-11-04 NOTE — Social Work (Signed)
MOB was referred for history of depression and anxiety.   * Referral screened out by Clinical Social Worker because none of the following criteria appear to apply:  ~ History of anxiety/depression during this pregnancy, or of post-partum depression following prior delivery. ~ Diagnosis of anxiety and/or depression within last 3 years. OR * MOB's symptoms currently being treated with medication and/or therapy. CSW reviewed chart and notes MOB currently treating with Sertraline 100mg.  Please contact the Clinical Social Worker if needs arise, by MOB request, or if MOB scores greater than 9/yes to question 10 on Edinburgh Postpartum Depression Screen.  Seira Cody, LCSWA Clinical Social Work [facility]  [phone]  

## 2020-11-04 NOTE — Progress Notes (Signed)
Plan of care explained. Patient is getting up fine. Patient denies any complaints at this time. Patient wanting to go home this afternoon. Pain meds explained.

## 2020-11-04 NOTE — Progress Notes (Signed)
Post Partum Day 1 Subjective: no complaints, up ad lib, voiding, and tolerating PO  Objective: Patient Vitals for the past 24 hrs:  BP Temp Temp src Pulse Resp SpO2  11/04/20 0500 (!) 108/56 97.8 F (36.6 C) Oral 68 16 98 %  11/04/20 0000 105/60 97.8 F (36.6 C) Oral 70 16 97 %  11/03/20 2020 112/61 98.4 F (36.9 C) Oral -- 18 98 %  11/03/20 1836 (!) 111/54 98 F (36.7 C) Oral 74 19 97 %  11/03/20 1618 (!) 112/45 97.9 F (36.6 C) Oral 82 -- 97 %  11/03/20 1512 (!) 109/57 98 F (36.7 C) Oral 67 20 98 %  11/03/20 1415 (!) 114/58 -- -- 72 -- --  11/03/20 1403 (!) 107/53 -- -- 79 -- --  11/03/20 1345 (!) 106/44 -- -- 81 -- --  11/03/20 1330 137/88 -- -- 84 -- --  11/03/20 1300 (!) 141/78 -- -- 81 -- --  11/03/20 1230 131/70 -- -- 81 18 --  11/03/20 1200 127/67 -- -- 75 -- --  11/03/20 1130 (!) 95/51 98 F (36.7 C) Oral 84 -- --  11/03/20 1100 (!) 97/45 -- -- 82 -- --  11/03/20 1030 (!) 108/44 -- -- 88 -- --  11/03/20 1000 (!) 93/43 -- -- 88 16 --  11/03/20 0933 127/67 -- -- 83 16 --    Physical Exam:  General: alert and no distress Lochia: appropriate Uterine Fundus: firm DVT Evaluation: No evidence of DVT seen on physical exam.  Recent Labs    11/03/20 0136 11/04/20 0600  WBC 11.3* 11.0*  HGB 11.0* 10.5*  HCT 33.9* 32.3*  PLT 287 254    Assessment/Plan: Discharge home  [place] D Bottino 30 y.o. G3P1011 PPD#1 sp TSVD 1. RPPC: Hgb 11>10.5, 1st degree repaired.  Rubella Immune, blood type A POS, bottle feeding, baby boy in room. Vaccines: tdap, flu completed. COVID series offered and declined. 2. Desires neonatal circumcision, R/B/A of procedure discussed at length. Pt understands that neonatal circumcision is not considered medically necessary and is elective. The risks include, but are not limited to bleeding, infection, damage to the penis, development of scar tissue, and having to have it redone at a later date. Pt understands theses risks and wishes to proceed    LOS: 1 day   Mumin Denomme K Taam-Akelman 11/04/2020, 9:04 AM

## 2020-11-04 NOTE — Discharge Summary (Signed)
Postpartum Discharge Summary   Patient Name: Lacey Sloan DOB: 1990-11-17 MRN: [id number]  Date of admission: 11/03/2020 Delivery date:11/03/2020  Delivering provider: Bobbye Charleston  Date of discharge: 11/04/2020  Admitting diagnosis: Normal labor [O80, Z37.9] Intrauterine pregnancy: [redacted]w[redacted]d    Secondary diagnosis:  Active Problems:   SVD (spontaneous vaginal delivery)  Additional problems: none    Discharge diagnosis: Term Pregnancy Delivered                                              Post partum procedures: none Augmentation: N/A Complications: None  Hospital course: Onset of Labor With Vaginal Delivery      30y.o. yo G3P1011 at 328w1das admitted in Active Labor on 11/03/2020. Patient had an uncomplicated labor course as follows:  Membrane Rupture Time/Date: 12:00 AM ,11/03/2020   Delivery Method:Vaginal, Spontaneous  Episiotomy:   Lacerations:  1st degree  Patient had an uncomplicated postpartum course.  She is ambulating, tolerating a regular diet, passing flatus, and urinating well. Patient is discharged home in stable condition on 11/04/20.  Newborn Data: Birth date:11/03/2020  Birth time:1:22 PM  Gender:Female  Living status:Living  Apgars:9 ,9  Weight:3084 g   Magnesium Sulfate received: No BMZ received: No Rhophylac:N/A MMR:N/A T-DaP:Given prenatally Flu: Already completed Transfusion:No  Physical exam  Vitals:   11/03/20 1836 11/03/20 2020 11/04/20 0000 11/04/20 0500  BP: (!) 111/54 112/61 105/60 (!) 108/56  Pulse: 74  70 68  Resp: '19 18 16 16  ' Temp: 98 F (36.7 C) 98.4 F (36.9 C) 97.8 F (36.6 C) 97.8 F (36.6 C)  TempSrc: Oral Oral Oral Oral  SpO2: 97% 98% 97% 98%  Weight:      Height:       General: alert and no distress Lochia: appropriate Uterine Fundus: firm DVT Evaluation: No evidence of DVT seen on physical exam. Labs: Lab Results  Component Value Date   WBC 11.0 (H) 11/04/2020   HGB 10.5 (L) 11/04/2020   HCT 32.3 (L)  11/04/2020   MCV 90.2 11/04/2020   PLT 254 11/04/2020   CMP Latest Ref Rng & Units 10/17/2018  Glucose 70 - 99 mg/dL 81  BUN 6 - 23 mg/dL 11  Creatinine 0.40 - 1.20 mg/dL 0.79  Sodium 135 - 145 mEq/L 138  Potassium 3.5 - 5.1 mEq/L 4.9  Chloride 96 - 112 mEq/L 103  CO2 19 - 32 mEq/L 27  Calcium 8.4 - 10.5 mg/dL 9.8  Total Protein 6.0 - 8.3 g/dL 7.1  Total Bilirubin 0.2 - 1.2 mg/dL 0.6  Alkaline Phos 39 - 117 U/L 59  AST 0 - 37 U/L 14  ALT 0 - 35 U/L 15   Edinburgh Score: Edinburgh Postnatal Depression Scale Screening Tool 11/04/2020  I have been able to laugh and see the funny side of things. 0  I have looked forward with enjoyment to things. 0  I have blamed myself unnecessarily when things went wrong. 1  I have been anxious or worried for no good reason. 0  I have felt scared or panicky for no good reason. 1  Things have been getting on top of me. 0  I have been so unhappy that I have had difficulty sleeping. 0  I have felt sad or miserable. 0  I have been so unhappy that I have been crying. 0  The thought  of harming myself has occurred to me. 0  Edinburgh Postnatal Depression Scale Total 2      After visit meds:  Allergies as of 11/04/2020   No Known Allergies      Medication List     STOP taking these medications    SUMAtriptan 50 MG tablet Commonly known as: Imitrex       TAKE these medications    acetaminophen 325 MG tablet Commonly known as: Tylenol Take 2 tablets (650 mg total) by mouth every 4 (four) hours as needed (for pain scale < 4).   ibuprofen 800 MG tablet Commonly known as: ADVIL Take 1 tablet (800 mg total) by mouth 3 (three) times daily.   sertraline 100 MG tablet Commonly known as: ZOLOFT Take 1 tablet (100 mg total) by mouth daily.         Discharge home in stable condition Infant Feeding: Bottle Infant Disposition:home with mother Discharge instruction: per After Visit Summary and Postpartum booklet. Activity: Advance as  tolerated. Pelvic rest for 6 weeks.  Diet: routine diet Anticipated Birth Control: Unsure Postpartum Appointment:4 weeks Additional Postpartum F/U:  none Future Appointments:No future appointments. Follow up Visit:  Follow-up Information     Bobbye Charleston, MD. Schedule an appointment as soon as possible for a visit in 4 week(s).   Specialty: Obstetrics and Gynecology Contact information: [address] [place] [address] [phone]                     11/04/2020 Jonelle Sidle, MD

## 2020-11-13 ENCOUNTER — Telehealth (HOSPITAL_COMMUNITY): Payer: Self-pay | Admitting: *Deleted

## 2020-11-13 NOTE — Telephone Encounter (Signed)
Left message for patient to return RN's hospital discharge follow-up phone call.

## 2021-02-24 IMAGING — DX DG KNEE AP/LAT W/ SUNRISE*R*
3 series · 3 of 3 positions shown · non-contrast
Comparison: None.

CLINICAL DATA: Chronic right knee pain.

EXAM:
RIGHT KNEE 3 VIEWS

[knee lat]
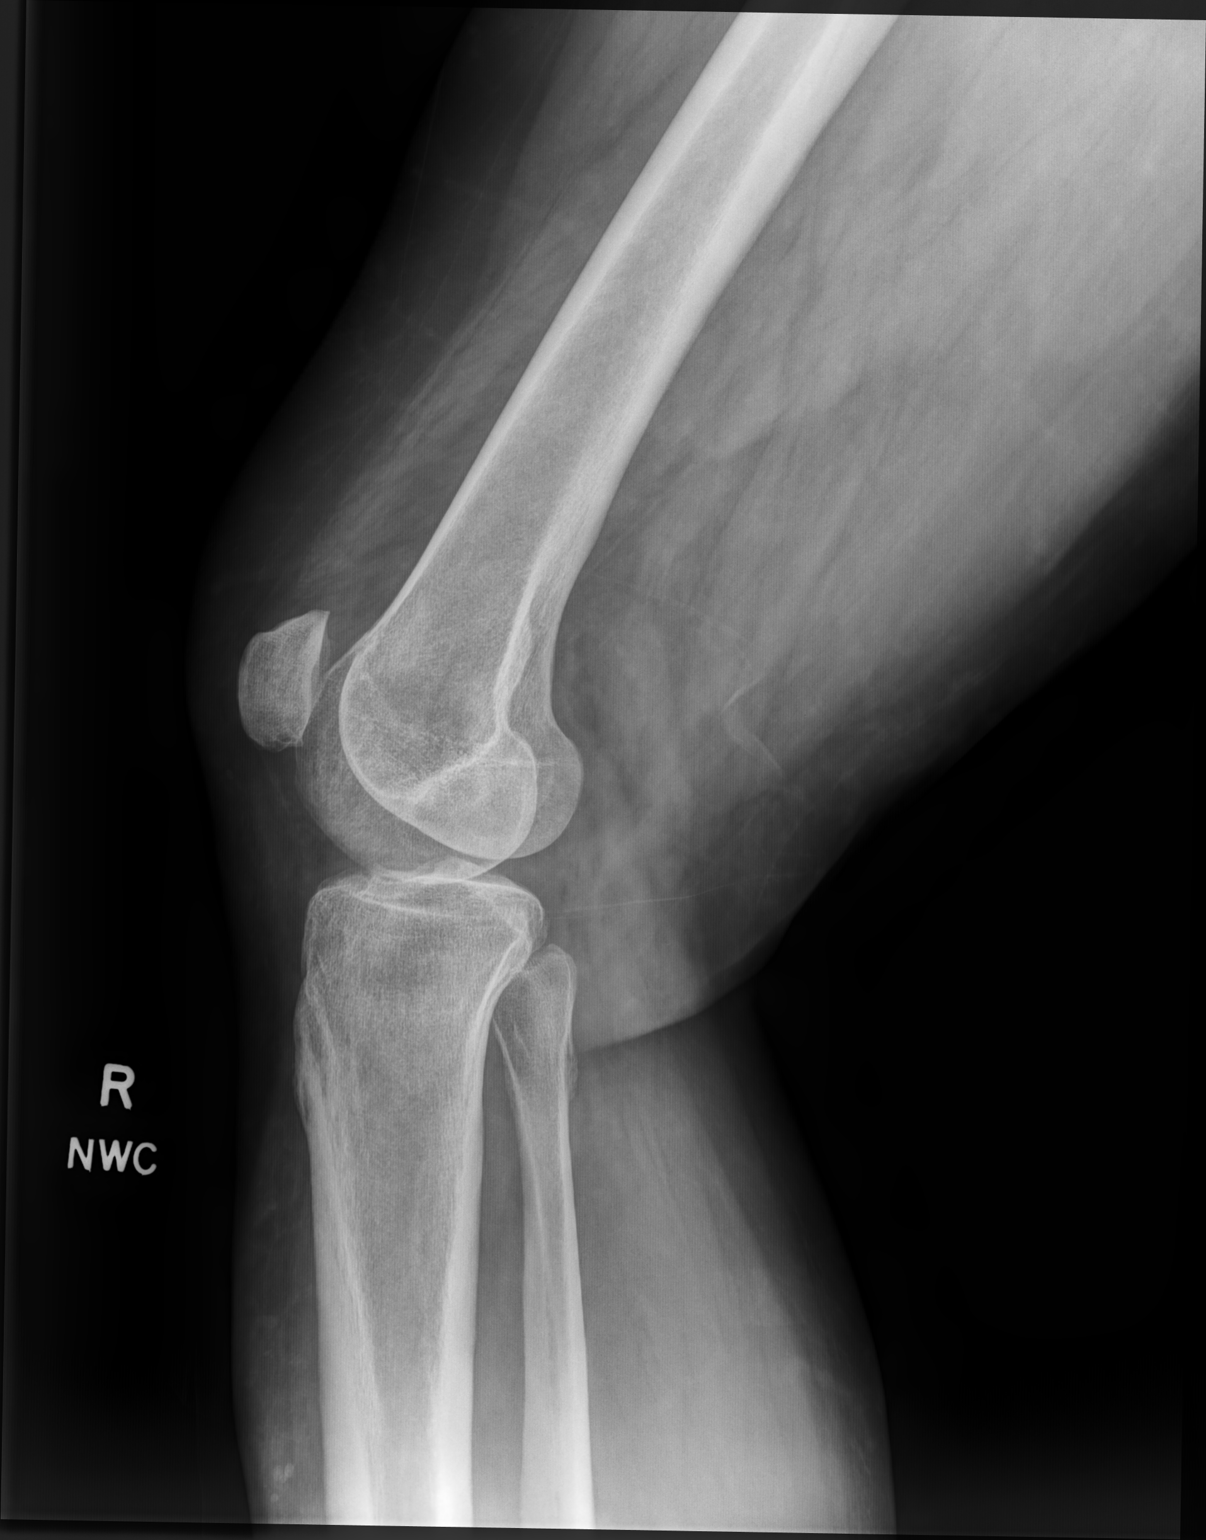

[knee mlo]
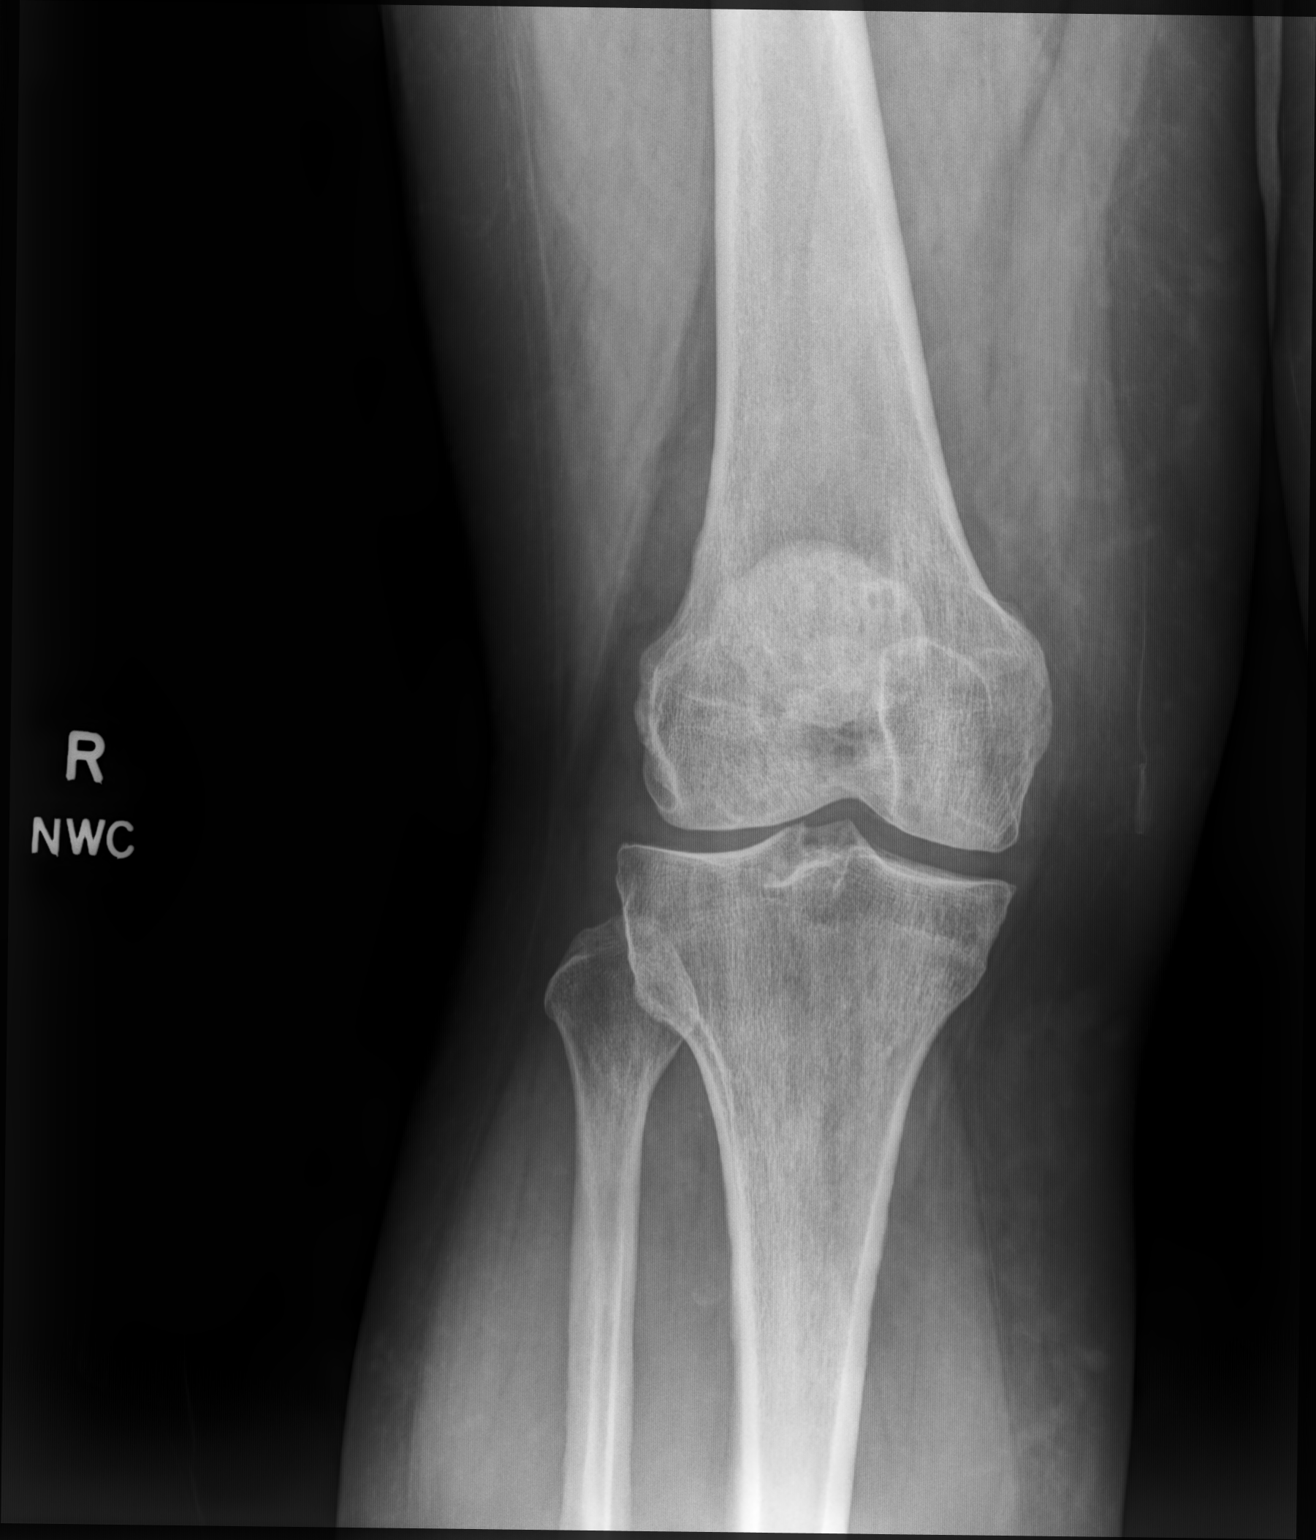

[patella (sunrise) tan]
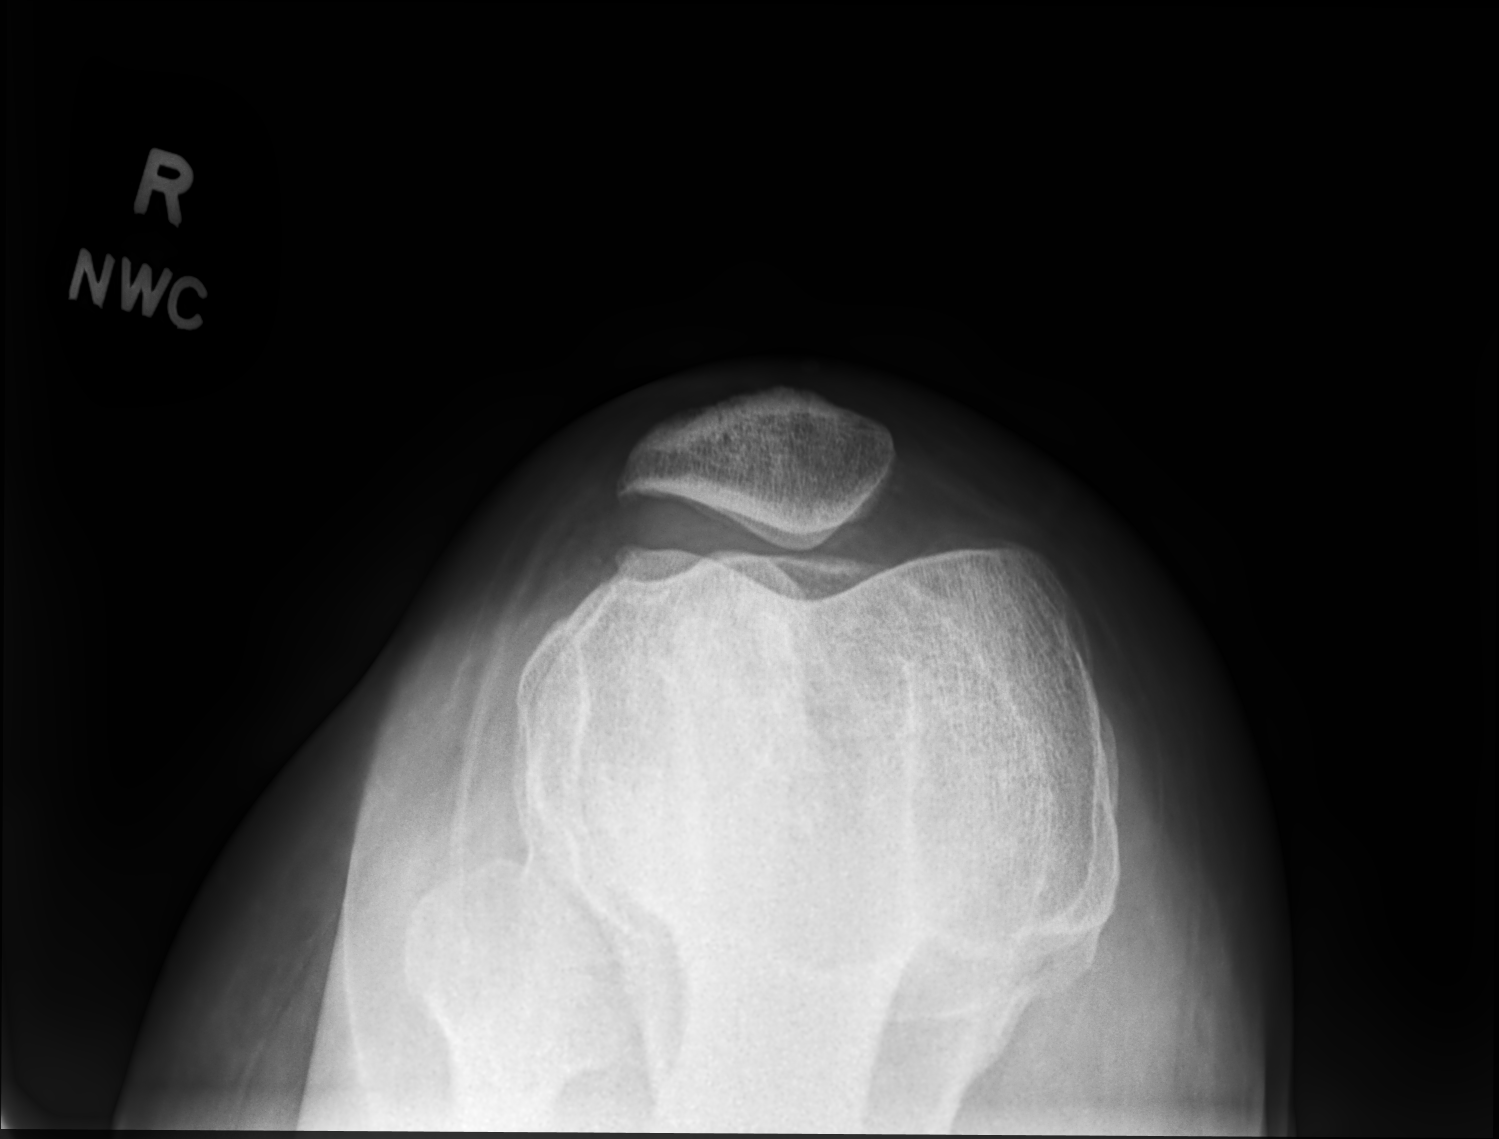

[3 of 3 positions shown; findings below may reference images not displayed]

FINDINGS: No evidence of fracture, dislocation, or joint effusion. Alignment
and joint spaces are normal. Trace patellofemoral spurring. No
significant joint effusion. No erosion, bony destruction periosteal
reaction. Soft tissues are unremarkable.
IMPRESSION: Trace patellofemoral spurring.

## 2021-03-19 LAB — OB RESULTS CONSOLE GC/CHLAMYDIA
Chlamydia: NEGATIVE
Neisseria Gonorrhea: NEGATIVE

## 2021-04-01 LAB — OB RESULTS CONSOLE RUBELLA ANTIBODY, IGM: Rubella: NON-IMMUNE/NOT IMMUNE

## 2021-04-01 LAB — OB RESULTS CONSOLE HEPATITIS B SURFACE ANTIGEN: Hepatitis B Surface Ag: NEGATIVE

## 2021-04-01 LAB — OB RESULTS CONSOLE HIV ANTIBODY (ROUTINE TESTING): HIV: NONREACTIVE

## 2021-05-10 NOTE — L&D Delivery Note (Signed)
Patient was C/C/-1 with BBOW lower.  Pt was set up and pushed twice and I was able to feel head come down. She pushed for 3 more contractions and I then ruptured bag- clear fluid and head was still high but applied and no cord. FHTs went to about 60s and pt was able to push once more to delivery of head.  She pushed for a total of about 7-9 minutes with epidural.    NSVD  female infant, Apgars 7,9, baby sluggish and initially had HR in 70s., weight P.   Cord was clamped and cut, and baby handed to nurses, NICU called and baby did well with blow by O2. The patient had no lacerations. Fundus was firm. EBL was expected amount. Placenta was delivered intact. Vagina was clear.  Delayed cord clamping was not done. Cord Ph pending.  Baby was vigorous and doing skin to skin with mother.  Loney Laurence

## 2021-05-18 ENCOUNTER — Other Ambulatory Visit: Payer: Self-pay | Admitting: Obstetrics and Gynecology

## 2021-05-18 ENCOUNTER — Other Ambulatory Visit: Payer: Self-pay

## 2021-05-18 DIAGNOSIS — Z363 Encounter for antenatal screening for malformations: Secondary | ICD-10-CM

## 2021-05-19 ENCOUNTER — Other Ambulatory Visit: Payer: Self-pay

## 2021-05-27 ENCOUNTER — Encounter: Payer: Self-pay | Admitting: *Deleted

## 2021-06-02 ENCOUNTER — Ambulatory Visit: Payer: 59 | Attending: Obstetrics and Gynecology

## 2021-06-02 ENCOUNTER — Other Ambulatory Visit: Payer: Self-pay

## 2021-06-02 ENCOUNTER — Encounter: Payer: Self-pay | Admitting: *Deleted

## 2021-06-02 ENCOUNTER — Ambulatory Visit: Payer: 59 | Admitting: *Deleted

## 2021-06-02 ENCOUNTER — Ambulatory Visit (HOSPITAL_BASED_OUTPATIENT_CLINIC_OR_DEPARTMENT_OTHER): Payer: 59 | Admitting: Obstetrics

## 2021-06-02 VITALS — BP 122/61 | HR 71

## 2021-06-02 DIAGNOSIS — Z363 Encounter for antenatal screening for malformations: Secondary | ICD-10-CM | POA: Diagnosis present

## 2021-06-02 DIAGNOSIS — Z6841 Body Mass Index (BMI) 40.0 and over, adult: Secondary | ICD-10-CM | POA: Diagnosis present

## 2021-06-02 DIAGNOSIS — Z3A19 19 weeks gestation of pregnancy: Secondary | ICD-10-CM | POA: Insufficient documentation

## 2021-06-02 DIAGNOSIS — O99212 Obesity complicating pregnancy, second trimester: Secondary | ICD-10-CM

## 2021-06-02 NOTE — Progress Notes (Signed)
MFM Note  Lacey Sloan was seen due to maternal obesity with a BMI of 50.  The patient reports two prior full-term uncomplicated vaginal deliveries.  She denies any significant past medical history and denies any history of hypertension or diabetes.  She does not have any significant past surgical history.  She had a cell free DNA test drawn earlier in her pregnancy that showed a low risk for trisomy 35, 55, and 13.  A female fetus is predicted.  She was informed that the fetal growth and amniotic fluid level appears appropriate for her gestational age.    There were no obvious fetal anomalies noted on today's exam.  Today's exam was limited due to maternal body habitus.  The limitations of ultrasound in the detection of all anomalies was discussed.    The patient was advised that maternal obesity increases the risk of adverse pregnancy outcomes such as miscarriages/stillbirth, congenital anomalies such as neural tube defects/spina bifida, and may increase the risk of preeclampsia and gestational diabetes requiring an indicated preterm birth.  The recommended total weight gain in pregnancy for obese women's between 10 to 20 pounds.  The patient reports that she already had a negative early screen for gestational diabetes. A repeat diabetes screen is recommended again at 24 to 28 weeks.  Due to maternal obesity, we will continue to follow her with monthly growth ultrasounds throughout pregnancy.   Due to the increased risk of stillbirth in obese women with a BMI of greater than 40, weekly antenatal fetal surveillance should be started at 34 weeks.  As maternal obesity may present challenges associated with the management of anesthesia, an anesthesia consult should be obtained when she is admitted in labor.  As obese women may be at increased risk for developing preeclampsia, she should start taking a daily baby aspirin (81 mg daily) as soon as possible and continue the daily baby aspirin for the  duration of her pregnancy.  A follow-up growth scan was scheduled in 4 weeks.    The patient stated that all of her questions had been answered.  A total of 30 minutes was spent counseling and coordinating the care for this patient.  Greater than 50% of the time was spent in direct face-to-face contact.

## 2021-06-03 ENCOUNTER — Other Ambulatory Visit: Payer: Self-pay | Admitting: *Deleted

## 2021-06-03 DIAGNOSIS — Z6841 Body Mass Index (BMI) 40.0 and over, adult: Secondary | ICD-10-CM

## 2021-06-03 DIAGNOSIS — O09899 Supervision of other high risk pregnancies, unspecified trimester: Secondary | ICD-10-CM

## 2021-06-30 ENCOUNTER — Ambulatory Visit: Payer: 59

## 2021-06-30 ENCOUNTER — Ambulatory Visit: Payer: 59 | Attending: Obstetrics

## 2021-07-09 LAB — OB RESULTS CONSOLE RPR: RPR: NONREACTIVE

## 2021-09-25 LAB — OB RESULTS CONSOLE GBS: GBS: NEGATIVE

## 2021-10-09 ENCOUNTER — Encounter (HOSPITAL_COMMUNITY): Payer: Self-pay | Admitting: Obstetrics and Gynecology

## 2021-10-09 ENCOUNTER — Inpatient Hospital Stay (HOSPITAL_COMMUNITY)
Admission: AD | Admit: 2021-10-09 | Discharge: 2021-10-11 | DRG: 807 | Disposition: A | Discharge status: Home / Self Care | Payer: 59 | Attending: Obstetrics and Gynecology | Admitting: Obstetrics and Gynecology

## 2021-10-09 DIAGNOSIS — O99214 Obesity complicating childbirth: Principal | ICD-10-CM | POA: Diagnosis present

## 2021-10-09 DIAGNOSIS — Z3A38 38 weeks gestation of pregnancy: Secondary | ICD-10-CM

## 2021-10-09 DIAGNOSIS — O9A213 Injury, poisoning and certain other consequences of external causes complicating pregnancy, third trimester: Principal | ICD-10-CM | POA: Diagnosis present

## 2021-10-09 DIAGNOSIS — Z87891 Personal history of nicotine dependence: Secondary | ICD-10-CM

## 2021-10-09 MED ORDER — CALCIUM CARBONATE ANTACID 500 MG PO CHEW: 2.0000 | CHEWABLE_TABLET | ORAL | Status: DC | PRN

## 2021-10-09 MED ORDER — LACTATED RINGERS IV SOLN: INTRAVENOUS | Status: DC

## 2021-10-09 MED ORDER — DOCUSATE SODIUM 100 MG PO CAPS: 100.0000 mg | ORAL_CAPSULE | Freq: Every day | ORAL | Status: DC

## 2021-10-09 MED ORDER — ACETAMINOPHEN 325 MG PO TABS: 650.0000 mg | ORAL_TABLET | ORAL | Status: DC | PRN

## 2021-10-09 MED ORDER — PRENATAL MULTIVITAMIN CH: 1.0000 | ORAL_TABLET | Freq: Every day | ORAL | Status: DC

## 2021-10-09 MED ORDER — ZOLPIDEM TARTRATE 5 MG PO TABS: 5.0000 mg | ORAL_TABLET | Freq: Every evening | ORAL | Status: DC | PRN

## 2021-10-09 NOTE — H&P (Signed)
Lacey Sloan is a 31 y.o. female [id number] at 80 /7 weeks (EDD 10/23/21 by LMP c/w 9 week [place]) presenting for contractions and possible LOF. Prenatal care significant for maternal obesity and a short inter-pregnancy interval. She is rubella non-immune.  OB History     Gravida  4   Para  1   Term  1   Preterm      AB  1   Living  1      SAB      IAB  1   Ectopic      Multiple  0   Live Births  1         10-20-2016, 40.4 wks M, 9lbs, Vaginal Delivery 11-03-2020, 39 wks M, 6lbs 13oz, NSVD  Past Medical History:  Diagnosis Date   History of psoriasis    History of shingles    Hx of varicella    Vaginal Pap smear, abnormal    Past Surgical History:  Procedure Laterality Date   DILATION AND CURETTAGE OF UTERUS     Family History: family history includes Cancer in her paternal grandfather; Diabetes in her maternal grandmother; Heart disease in her maternal grandmother and paternal grandfather; Kidney disease in her maternal grandmother. Social History:  reports that she has quit smoking. She quit smokeless tobacco use about 20 months ago. She reports that she does not currently use alcohol. She reports that she does not use drugs.     Maternal Diabetes: No Genetic Screening: Normal Maternal Ultrasounds/Referrals: Normal Fetal Ultrasounds or other Referrals:  None Maternal Substance Abuse:  No Significant Maternal Medications:  None Significant Maternal Lab Results:  Group B Strep negative Other Comments:  None  Review of Systems  Constitutional:  Negative for fever.  Gastrointestinal:  Positive for abdominal pain.  Genitourinary:  Negative for vaginal bleeding.  Maternal Medical History:  Reason for admission: Contractions.   Contractions: Frequency: irregular.   Perceived severity is mild.   Fetal activity: Perceived fetal activity is normal.   Prenatal complications: Obesity Prenatal Complications - Diabetes: none.    Last menstrual period  01/16/2021, unknown if currently breastfeeding. Maternal Exam:  Uterine Assessment: Contraction strength is mild.  Contraction frequency is regular.  Abdomen: Patient reports no abdominal tenderness. Fetal presentation: vertex Introitus: Normal vulva. Normal vagina.   Physical Exam Cardiovascular:     Rate and Rhythm: Normal rate and regular rhythm.  Pulmonary:     Effort: Pulmonary effort is normal.  Abdominal:     Palpations: Abdomen is soft.  Genitourinary:    General: Normal vulva.  Neurological:     Mental Status: She is alert.  Psychiatric:        Mood and Affect: Mood normal.    Prenatal labs: ABO, Rh: --/--/A POS (06/27 0136) Antibody: NEG (06/27 0136) Rubella:  Non-Immune RPR: NON REACTIVE (06/27 0136)  HBsAg:   Neg HIV:   NR GBS: Negative  NIPT low risk One hour GCT 106  Assessment/Plan: Pt was observed in MAU and negative for ROM but cervix thought to be slowly changing from 4cm to 5cm and uncomfortable so admitted for labor.  Will get epidural as desired  Oliver Pila 10/09/2021, 11:14 PM

## 2021-10-09 NOTE — MAU Note (Signed)
.  Lacey Sloan is a 31 y.o. at [redacted]w[redacted]d here in MAU reporting: possible SROM around 1700 of pinkish fluid mixed with mucus plug. Pt reports she has been losing her mucus plug for the last couple days.  CTX every 5-15 mins. PT denies dfm, vb, abnormal discharge, PIH s/s, recent intercourse, and complications in the pregnancy.  SVE 3 cm on 5/31  Onset of complaint: 1700 Pain score: 6/10 Vitals:   10/09/21 2318  BP: 130/66  Pulse: 79  Resp: 20  Temp: 97.6 F (36.4 C)  SpO2: 95%     FHT:185 Lab orders placed from triage:

## 2021-10-10 ENCOUNTER — Encounter (HOSPITAL_COMMUNITY): Payer: Self-pay | Admitting: Obstetrics and Gynecology

## 2021-10-10 ENCOUNTER — Inpatient Hospital Stay (HOSPITAL_COMMUNITY): Payer: 59 | Admitting: Anesthesiology

## 2021-10-10 ENCOUNTER — Other Ambulatory Visit: Payer: Self-pay

## 2021-10-10 DIAGNOSIS — Z87891 Personal history of nicotine dependence: Secondary | ICD-10-CM | POA: Diagnosis not present

## 2021-10-10 DIAGNOSIS — Z3A38 38 weeks gestation of pregnancy: Secondary | ICD-10-CM | POA: Diagnosis not present

## 2021-10-10 DIAGNOSIS — O26893 Other specified pregnancy related conditions, third trimester: Secondary | ICD-10-CM | POA: Diagnosis present

## 2021-10-10 DIAGNOSIS — O99214 Obesity complicating childbirth: Secondary | ICD-10-CM | POA: Diagnosis present

## 2021-10-10 LAB — POCT FERN TEST: POCT Fern Test: NEGATIVE

## 2021-10-10 LAB — CBC
HCT: 32.1 % — ABNORMAL LOW (ref 36.0–46.0)
Hemoglobin: 10.9 g/dL — ABNORMAL LOW (ref 12.0–15.0)
MCH: 29.5 pg (ref 26.0–34.0)
MCHC: 34 g/dL (ref 30.0–36.0)
MCV: 86.8 fL (ref 80.0–100.0)
Platelets: 316 10*3/uL (ref 150–400)
RBC: 3.7 MIL/uL — ABNORMAL LOW (ref 3.87–5.11)
RDW: 13.5 % (ref 11.5–15.5)
WBC: 12.9 10*3/uL — ABNORMAL HIGH (ref 4.0–10.5)
nRBC: 0 % (ref 0.0–0.2)

## 2021-10-10 LAB — TYPE AND SCREEN
ABO/RH(D): A POS
Antibody Screen: NEGATIVE

## 2021-10-10 LAB — RPR: RPR Ser Ql: NONREACTIVE

## 2021-10-10 MED ORDER — MAGNESIUM HYDROXIDE 400 MG/5ML PO SUSP: 30.0000 mL | ORAL | Status: DC | PRN

## 2021-10-10 MED ORDER — EPHEDRINE 5 MG/ML INJ
10.0000 mg | INTRAVENOUS | Status: DC | PRN
Start: 2021-10-10 — End: 2021-10-10

## 2021-10-10 MED ORDER — OXYTOCIN-SODIUM CHLORIDE 30-0.9 UT/500ML-% IV SOLN
1.0000 m[IU]/min | INTRAVENOUS | Status: DC
  Administered 2021-10-10: 2 m[IU]/min via INTRAVENOUS

## 2021-10-10 MED ORDER — FLEET ENEMA 7-19 GM/118ML RE ENEM
1.0000 | ENEMA | RECTAL | Status: DC | PRN
Start: 2021-10-10 — End: 2021-10-10

## 2021-10-10 MED ORDER — OXYCODONE-ACETAMINOPHEN 5-325 MG PO TABS: 2.0000 | ORAL_TABLET | ORAL | Status: DC | PRN

## 2021-10-10 MED ORDER — ONDANSETRON HCL 4 MG PO TABS: 4.0000 mg | ORAL_TABLET | ORAL | Status: DC | PRN

## 2021-10-10 MED ORDER — SIMETHICONE 80 MG PO CHEW: 80.0000 mg | CHEWABLE_TABLET | ORAL | Status: DC | PRN

## 2021-10-10 MED ORDER — IBUPROFEN 800 MG PO TABS
800.0000 mg | ORAL_TABLET | Freq: Three times a day (TID) | ORAL | Status: DC
  Administered 2021-10-10 – 2021-10-11 (×4): 800 mg via ORAL
  Filled 2021-10-10 (×4): qty 1

## 2021-10-10 MED ORDER — PHENYLEPHRINE 80 MCG/ML (10ML) SYRINGE FOR IV PUSH (FOR BLOOD PRESSURE SUPPORT): 80.0000 ug | PREFILLED_SYRINGE | INTRAVENOUS | Status: DC | PRN

## 2021-10-10 MED ORDER — SODIUM CHLORIDE 0.9% FLUSH: 3.0000 mL | INTRAVENOUS | Status: DC | PRN

## 2021-10-10 MED ORDER — LIDOCAINE-EPINEPHRINE (PF) 1.5 %-1:200000 IJ SOLN
INTRAMUSCULAR | Status: DC | PRN
  Administered 2021-10-10: 5 mL via EPIDURAL

## 2021-10-10 MED ORDER — OXYCODONE-ACETAMINOPHEN 5-325 MG PO TABS: 1.0000 | ORAL_TABLET | ORAL | Status: DC | PRN

## 2021-10-10 MED ORDER — LACTATED RINGERS IV SOLN
500.0000 mL | INTRAVENOUS | Status: DC | PRN
  Administered 2021-10-10: 1000 mL via INTRAVENOUS

## 2021-10-10 MED ORDER — WITCH HAZEL-GLYCERIN EX PADS: 1.0000 "application " | MEDICATED_PAD | CUTANEOUS | Status: DC | PRN

## 2021-10-10 MED ORDER — ACETAMINOPHEN 325 MG PO TABS: 650.0000 mg | ORAL_TABLET | ORAL | Status: DC | PRN

## 2021-10-10 MED ORDER — SOD CITRATE-CITRIC ACID 500-334 MG/5ML PO SOLN: 30.0000 mL | ORAL | Status: DC | PRN

## 2021-10-10 MED ORDER — OXYTOCIN-SODIUM CHLORIDE 30-0.9 UT/500ML-% IV SOLN
2.5000 [IU]/h | INTRAVENOUS | Status: DC
  Filled 2021-10-10: qty 500

## 2021-10-10 MED ORDER — LIDOCAINE HCL (PF) 1 % IJ SOLN
INTRAMUSCULAR | Status: DC | PRN
  Administered 2021-10-10: 5 mL via EPIDURAL

## 2021-10-10 MED ORDER — TETANUS-DIPHTH-ACELL PERTUSSIS 5-2.5-18.5 LF-MCG/0.5 IM SUSY: 0.5000 mL | PREFILLED_SYRINGE | Freq: Once | INTRAMUSCULAR | Status: DC

## 2021-10-10 MED ORDER — COCONUT OIL OIL: 1.0000 "application " | TOPICAL_OIL | Status: DC | PRN

## 2021-10-10 MED ORDER — FERROUS SULFATE 325 (65 FE) MG PO TABS
325.0000 mg | ORAL_TABLET | Freq: Two times a day (BID) | ORAL | Status: DC
  Administered 2021-10-10 – 2021-10-11 (×2): 325 mg via ORAL
  Filled 2021-10-10 (×2): qty 1

## 2021-10-10 MED ORDER — TERBUTALINE SULFATE 1 MG/ML IJ SOLN: 0.2500 mg | Freq: Once | INTRAMUSCULAR | Status: DC | PRN

## 2021-10-10 MED ORDER — FENTANYL-BUPIVACAINE-NACL 0.5-0.125-0.9 MG/250ML-% EP SOLN
12.0000 mL/h | EPIDURAL | Status: DC | PRN
  Administered 2021-10-10: 12 mL/h via EPIDURAL
  Filled 2021-10-10: qty 250

## 2021-10-10 MED ORDER — SENNOSIDES-DOCUSATE SODIUM 8.6-50 MG PO TABS
2.0000 | ORAL_TABLET | Freq: Every day | ORAL | Status: DC
  Administered 2021-10-11: 2 via ORAL
  Filled 2021-10-10: qty 2

## 2021-10-10 MED ORDER — METHYLERGONOVINE MALEATE 0.2 MG PO TABS: 0.2000 mg | ORAL_TABLET | ORAL | Status: DC | PRN

## 2021-10-10 MED ORDER — LACTATED RINGERS IV SOLN: INTRAVENOUS | Status: DC

## 2021-10-10 MED ORDER — SODIUM CHLORIDE 0.9% FLUSH: 3.0000 mL | Freq: Two times a day (BID) | INTRAVENOUS | Status: DC

## 2021-10-10 MED ORDER — DIPHENHYDRAMINE HCL 25 MG PO CAPS: 25.0000 mg | ORAL_CAPSULE | Freq: Four times a day (QID) | ORAL | Status: DC | PRN

## 2021-10-10 MED ORDER — LIDOCAINE HCL (PF) 1 % IJ SOLN: 30.0000 mL | INTRAMUSCULAR | Status: DC | PRN

## 2021-10-10 MED ORDER — BENZOCAINE-MENTHOL 20-0.5 % EX AERO: 1.0000 "application " | INHALATION_SPRAY | CUTANEOUS | Status: DC | PRN

## 2021-10-10 MED ORDER — DIPHENHYDRAMINE HCL 50 MG/ML IJ SOLN
12.5000 mg | INTRAMUSCULAR | Status: DC | PRN
  Administered 2021-10-10: 12.5 mg via INTRAVENOUS
  Filled 2021-10-10: qty 1

## 2021-10-10 MED ORDER — LACTATED RINGERS IV SOLN
500.0000 mL | Freq: Once | INTRAVENOUS | Status: AC
  Administered 2021-10-10: 500 mL via INTRAVENOUS

## 2021-10-10 MED ORDER — METHYLERGONOVINE MALEATE 0.2 MG/ML IJ SOLN: 0.2000 mg | INTRAMUSCULAR | Status: DC | PRN

## 2021-10-10 MED ORDER — SODIUM CHLORIDE 0.9 % IV SOLN: 250.0000 mL | INTRAVENOUS | Status: DC | PRN

## 2021-10-10 MED ORDER — OXYTOCIN BOLUS FROM INFUSION
333.0000 mL | Freq: Once | INTRAVENOUS | Status: AC
  Administered 2021-10-10: 333 mL via INTRAVENOUS

## 2021-10-10 MED ORDER — ONDANSETRON HCL 4 MG/2ML IJ SOLN: 4.0000 mg | INTRAMUSCULAR | Status: DC | PRN

## 2021-10-10 MED ORDER — ONDANSETRON HCL 4 MG/2ML IJ SOLN
4.0000 mg | Freq: Four times a day (QID) | INTRAMUSCULAR | Status: DC | PRN
Start: 2021-10-10 — End: 2021-10-10

## 2021-10-10 MED ORDER — MEASLES, MUMPS & RUBELLA VAC IJ SOLR: 0.5000 mL | Freq: Once | INTRAMUSCULAR | Status: DC

## 2021-10-10 MED ORDER — DIBUCAINE (PERIANAL) 1 % EX OINT: 1.0000 "application " | TOPICAL_OINTMENT | CUTANEOUS | Status: DC | PRN

## 2021-10-10 MED ORDER — SERTRALINE HCL 100 MG PO TABS
100.0000 mg | ORAL_TABLET | Freq: Every day | ORAL | Status: DC
  Filled 2021-10-10: qty 1

## 2021-10-10 MED ORDER — PRENATAL MULTIVITAMIN CH
1.0000 | ORAL_TABLET | Freq: Every day | ORAL | Status: DC
  Administered 2021-10-11: 1 via ORAL
  Filled 2021-10-10: qty 1

## 2021-10-10 MED ORDER — ZOLPIDEM TARTRATE 5 MG PO TABS: 5.0000 mg | ORAL_TABLET | Freq: Every evening | ORAL | Status: DC | PRN

## 2021-10-10 NOTE — Plan of Care (Signed)
Lacey Sloan . completed

## 2021-10-10 NOTE — Progress Notes (Signed)
Patient ID: Lacey Sloan, female   DOB: December 15, 1990, 31 y.o.   MRN: [id number] Pt just received epidural and comfortable   Afeb VSS FHR category 1  Cervix 80/5+/ballotable Per RN exam  Will recheck in a few hours and see if vertex descends a bit for AROM

## 2021-10-10 NOTE — Lactation Note (Signed)
This note was copied from a baby's chart. Lactation Consultation Note  Patient Name: Lacey Sloan Today's Date: 10/10/2021   Age:31 hours LC went in to assist with breastfeeding. Mother states formula only.   Maternal Data    Feeding Nipple Type: Slow - flow  LATCH Score                    Lactation Tools Discussed/Used    Interventions    Discharge    Consult Status      Clio Gerhart  Nicholson-Springer 10/10/2021, 6:07 PM

## 2021-10-10 NOTE — Anesthesia Preprocedure Evaluation (Signed)
Anesthesia Evaluation  Patient identified by MRN, date of birth, ID band Patient awake    Reviewed: Allergy & Precautions, H&P , NPO status , Patient's Chart, lab work & pertinent test results  History of Anesthesia Complications Negative for: history of anesthetic complications  Airway Mallampati: II  TM Distance: >3 FB Neck ROM: full    Dental no notable dental hx. (+) Teeth Intact   Pulmonary former smoker,    Pulmonary exam normal breath sounds clear to auscultation       Cardiovascular negative cardio ROS Normal cardiovascular exam Rhythm:regular Rate:Normal     Neuro/Psych  Headaches, PSYCHIATRIC DISORDERS Anxiety Depression    GI/Hepatic negative GI ROS, Neg liver ROS,   Endo/Other  Morbid obesity  Renal/GU negative Renal ROS  negative genitourinary   Musculoskeletal   Abdominal (+) + obese,   Peds  Hematology  (+) Blood dyscrasia, anemia ,   Anesthesia Other Findings   Reproductive/Obstetrics (+) Pregnancy                             Anesthesia Physical Anesthesia Plan  ASA: 3  Anesthesia Plan: Epidural   Post-op Pain Management:    Induction:   PONV Risk Score and Plan:   Airway Management Planned:   Additional Equipment:   Intra-op Plan:   Post-operative Plan:   Informed Consent: I have reviewed the patients History and Physical, chart, labs and discussed the procedure including the risks, benefits and alternatives for the proposed anesthesia with the patient or authorized representative who has indicated his/her understanding and acceptance.       Plan Discussed with:   Anesthesia Plan Comments:         Anesthesia Quick Evaluation

## 2021-10-10 NOTE — MAU Provider Note (Addendum)
S: Lacey Sloan is a 31 y.o. [phone] at [redacted]w[redacted]d who presents to MAU today complaining of leaking of fluid since 5 pm on 6/2. She denies vaginal bleeding. She endorses contractions that were initially every 5-15 minutes, but they have now gotten closer together and more intense since arrival to MAU. She reports normal fetal movement.    O: BP 130/66 (BP Location: Right Wrist)   Pulse 79   Temp 97.6 F (36.4 C) (Oral)   Resp 20   Ht 5\' 5"  (1.651 m)   Wt (!) 148.8 kg   LMP 01/16/2021   SpO2 95%   BMI 54.60 kg/m   GENERAL: Well-developed, well-nourished female in no acute distress.  HEAD: Normocephalic, atraumatic.  CHEST: Normal effort of breathing, normal heart rate. ABDOMEN: Soft, nontender, gravid. PELVIC: Normal external female genitalia. Vagina is pink and rugated. Cervix with normal contour, no lesions. Normal discharge with small amount of bright red blood noted.  Negative pooling.   Cervical exam:  Dilation: 4 Effacement (%): 80 Station: -2 Presentation: Vertex Exam by:: Dr.Kejon Feild  Dilation: 5 Effacement (%): 80 Station: -2 Presentation: Vertex Exam by:: Dr.Keeleigh Terris  Bulging bag of water noted on exam.   Fetal Monitoring: Baseline: 125 bpm Variability: Moderate Accelerations: Present  Decelerations: Absent  Contractions: Every 2-3 minutes   No results found for this or any previous visit (from the past 24 hour(s)).  A: [facility] at [redacted]w[redacted]d  Membranes intact (fern negative, negative pooling) Normal labor  P: Admit to L&D Further management per L&D team  [redacted]w[redacted]d, MD 10/10/2021 12:15 AM

## 2021-10-10 NOTE — Anesthesia Procedure Notes (Signed)
Epidural Patient location during procedure: OB Start time: 10/10/2021 2:20 AM End time: 10/10/2021 2:30 AM  Staffing Anesthesiologist: Leonides Grills, MD Performed: anesthesiologist   Preanesthetic Checklist Completed: patient identified, IV checked, site marked, risks and benefits discussed, monitors and equipment checked, pre-op evaluation and timeout performed  Epidural Patient position: sitting Prep: DuraPrep Patient monitoring: heart rate, cardiac monitor, continuous pulse ox and blood pressure Approach: midline Location: L3-L4 Injection technique: LOR air  Needle:  Needle type: Tuohy  Needle gauge: 17 G Needle length: 9 cm Needle insertion depth: 8 cm Catheter type: closed end flexible Catheter size: 19 Gauge Catheter at skin depth: 15 cm Test dose: negative and 1.5% lidocaine with Epi 1:200 K  Assessment Events: blood not aspirated, injection not painful, no injection resistance and negative IV test  Additional Notes Informed consent obtained prior to proceeding including risk of failure, 1% risk of PDPH, risk of minor discomfort and bruising. Discussed alternatives to epidural analgesia and patient desires to proceed.  Timeout performed pre-procedure verifying patient name, procedure, and platelet count.  Patient tolerated procedure well. Reason for block:procedure for pain

## 2021-10-11 LAB — CBC
HCT: 31 % — ABNORMAL LOW (ref 36.0–46.0)
Hemoglobin: 10.3 g/dL — ABNORMAL LOW (ref 12.0–15.0)
MCH: 28.9 pg (ref 26.0–34.0)
MCHC: 33.2 g/dL (ref 30.0–36.0)
MCV: 86.8 fL (ref 80.0–100.0)
Platelets: 232 10*3/uL (ref 150–400)
RBC: 3.57 MIL/uL — ABNORMAL LOW (ref 3.87–5.11)
RDW: 13.6 % (ref 11.5–15.5)
WBC: 21.4 10*3/uL — ABNORMAL HIGH (ref 4.0–10.5)
nRBC: 0 % (ref 0.0–0.2)

## 2021-10-11 NOTE — Progress Notes (Signed)
Patient is eating, ambulating, voiding.  Pain control is good.  Vitals:   10/10/21 1700 10/10/21 2245 10/11/21 0204 10/11/21 0547  BP: (!) 129/52 130/65 118/60 (!) 105/56  Pulse: 98 68 70 71  Resp: '18 18 16 16  ' Temp: 99.4 F (37.4 C) 98.1 F (36.7 C) 98.8 F (37.1 C) 98 F (36.7 C)  TempSrc: Oral Oral Oral Oral  SpO2: 100%     Weight:      Height:        Fundus firm Perineum without swelling.  Lab Results  Component Value Date   WBC 21.4 (H) 10/11/2021   HGB 10.3 (L) 10/11/2021   HCT 31.0 (L) 10/11/2021   MCV 86.8 10/11/2021   PLT 232 10/11/2021    --/--/A POS (06/03 0120)/RNonImmune.  A/P Post partum day 1.  Routine care.  Expect d/c routine.  MMR.   Parents desires circumsision.  All risks, benefits and alternatives discussed with the mother.   Daria Pastures

## 2021-10-11 NOTE — Anesthesia Postprocedure Evaluation (Signed)
Anesthesia Post Note  Patient: Lacey Sloan  Procedure(s) Performed: AN AD HOC LABOR EPIDURAL     Patient location during evaluation: Mother Baby Anesthesia Type: Epidural Level of consciousness: awake and alert Pain management: pain level controlled Vital Signs Assessment: post-procedure vital signs reviewed and stable Respiratory status: spontaneous breathing, nonlabored ventilation and respiratory function stable Cardiovascular status: stable Postop Assessment: no headache, no backache, epidural receding, no apparent nausea or vomiting, patient able to bend at knees, adequate PO intake and able to ambulate Anesthetic complications: no   No notable events documented.  Last Vitals:  Vitals:   10/11/21 0204 10/11/21 0547  BP: 118/60 (!) 105/56  Pulse: 70 71  Resp: 16 16  Temp: 37.1 C 36.7 C  SpO2:      Last Pain:  Vitals:   10/11/21 0800  TempSrc:   PainSc: 3    Pain Goal: Patients Stated Pain Goal: 3 (10/11/21 0800)                 Laban Emperor

## 2021-10-11 NOTE — Progress Notes (Addendum)
MOB was referred for history of depression/anxiety. * Referral screened out by Clinical Social Worker because none of the following criteria appear to apply: ~ History of anxiety/depression during this pregnancy, or of post-partum depression following prior delivery. ~ Diagnosis of anxiety and/or depression within last 3 years OR * MOB's symptoms currently being treated with medication and/or therapy. MOB has an active Rx for Zoloft (100mg); No concerns were noted in OB records.   Please contact the Clinical Social Worker if needs arise, by MOB request, or if MOB scores greater than 9/yes to question 10 on Edinburgh Postpartum Depression Screen.   Geraldine Sandberg Boyd-Gilyard, MSW, LCSW Clinical Social Work [phone] 

## 2021-10-11 NOTE — Discharge Summary (Signed)
Postpartum Discharge Summary  Date of Service updated      Patient Name: Lacey Sloan DOB: [birth year] MRN: [id number]  Date of admission: 10/09/2021 Delivery date:10/10/2021  Delivering provider: Bobbye Charleston  Date of discharge: 10/11/2021  Admitting diagnosis: Traumatic injury during pregnancy, antepartum, third trimester [O9A.213] Normal labor [O80, Z37.9] Intrauterine pregnancy: [redacted]w[redacted]d    Secondary diagnosis:  Principal Problem:   Traumatic injury during pregnancy, antepartum, third trimester Active Problems:   Normal labor  Additional problems: none    Discharge diagnosis: Term Pregnancy Delivered                                              Post partum procedures: none Augmentation: Pitocin Complications: None  Hospital course: Onset of Labor With Vaginal Delivery      31y.o. yo [id number] admitted in Active Labor on 10/09/2021. Patient had an uncomplicated labor course as follows:  Membrane Rupture Time/Date: 2:19 PM ,10/10/2021   Delivery Method:Vaginal, Spontaneous  Episiotomy: None  Lacerations:  None  Patient had an uncomplicated postpartum course.  She is ambulating, tolerating a regular diet, passing flatus, and urinating well. Patient is discharged home in stable condition on 10/11/21.  Newborn Data: Birth date:10/10/2021  Birth time:2:21 PM  Gender:Female  Living status:Living  Apgars:7 ,9  Weight:3490 g   Magnesium Sulfate received: No BMZ received: No Rhophylac:No MMR:Yes T-DaP:Given prenatally Flu: Yes Transfusion:No  Physical exam  Vitals:   10/10/21 1700 10/10/21 2245 10/11/21 0204 10/11/21 0547  BP: (!) 129/52 130/65 118/60 (!) 105/56  Pulse: 98 68 70 71  Resp: '18 18 16 16  ' Temp: 99.4 F (37.4 C) 98.1 F (36.7 C) 98.8 F (37.1 C) 98 F (36.7 C)  TempSrc: Oral Oral Oral Oral  SpO2: 100%     Weight:      Height:        Labs: Lab Results  Component Value Date   WBC 21.4 (H) 10/11/2021   HGB 10.3 (L) 10/11/2021   HCT  31.0 (L) 10/11/2021   MCV 86.8 10/11/2021   PLT 232 10/11/2021      Latest Ref Rng & Units 10/17/2018    4:39 PM  CMP  Glucose 70 - 99 mg/dL 81    BUN 6 - 23 mg/dL 11    Creatinine 0.40 - 1.20 mg/dL 0.79    Sodium 135 - 145 mEq/L 138    Potassium 3.5 - 5.1 mEq/L 4.9    Chloride 96 - 112 mEq/L 103    CO2 19 - 32 mEq/L 27    Calcium 8.4 - 10.5 mg/dL 9.8    Total Protein 6.0 - 8.3 g/dL 7.1    Total Bilirubin 0.2 - 1.2 mg/dL 0.6    Alkaline Phos 39 - 117 U/L 59    AST 0 - 37 U/L 14    ALT 0 - 35 U/L 15     Edinburgh Score:    10/11/2021   10:15 AM  Edinburgh Postnatal Depression Scale Screening Tool  I have been able to laugh and see the funny side of things. 0  I have looked forward with enjoyment to things. 0  I have blamed myself unnecessarily when things went wrong. 1  I have been anxious or worried for no good reason. 0  I have felt scared or panicky for no good reason.  0  Things have been getting on top of me. 0  I have been so unhappy that I have had difficulty sleeping. 0  I have felt sad or miserable. 0  I have been so unhappy that I have been crying. 1  The thought of harming myself has occurred to me. 0  Edinburgh Postnatal Depression Scale Total 2      After visit meds:  Allergies as of 10/11/2021   No Known Allergies      Medication List     TAKE these medications    acetaminophen 325 MG tablet Commonly known as: Tylenol Take 2 tablets (650 mg total) by mouth every 4 (four) hours as needed (for pain scale < 4).   PRENATAL VITAMINS PO Take by mouth.   sertraline 100 MG tablet Commonly known as: ZOLOFT Take 1 tablet (100 mg total) by mouth daily.               Discharge Care Instructions  (From admission, onward)           Start     Ordered   10/11/21 0000  Discharge wound care:       Comments: Sitz baths and icepacks to perineum.  If stitches, they will dissolve.   10/11/21 1551             Discharge home in stable  condition Infant Feeding: Bottle Infant Disposition:home with mother Discharge instruction: per After Visit Summary and Postpartum booklet. Activity: Advance as tolerated. Pelvic rest for 6 weeks.  Diet: carb modified diet Anticipated Birth Control: Unsure Postpartum Appointment:4 weeks Additional Postpartum F/U:  none Future Appointments:No future appointments. Follow up Visit:  Follow-up Information     Bobbye Charleston, MD Follow up in 4 week(s).   Specialty: Obstetrics and Gynecology Contact information: [address] [place]. [place] [address] [phone]                     10/11/2021 Daria Pastures, MD

## 2021-10-20 ENCOUNTER — Telehealth (HOSPITAL_COMMUNITY): Payer: Self-pay | Admitting: *Deleted

## 2021-10-20 NOTE — Telephone Encounter (Signed)
Attempted Hospital Discharge Follow-Up Call.  Left voice mail requesting that patient return RN's phone call.  

## 2021-12-08 ENCOUNTER — Encounter: Payer: Self-pay | Admitting: Internal Medicine

## 2021-12-08 NOTE — Patient Instructions (Signed)
Medications changes include :   wegovy 0.25 mg weekly   Your prescription(s) have been sent to your pharmacy.    Return in about 3 months (around 03/11/2022) for  , follow up weight loss.   Health Maintenance, Female Adopting a healthy lifestyle and getting preventive care are important in promoting health and wellness. Ask your health care provider about: The right schedule for you to have regular tests and exams. Things you can do on your own to prevent diseases and keep yourself healthy. What should I know about diet, weight, and exercise? Eat a healthy diet  Eat a diet that includes plenty of vegetables, fruits, low-fat dairy products, and lean protein. Do not eat a lot of foods that are high in solid fats, added sugars, or sodium. Maintain a healthy weight Body mass index (BMI) is used to identify weight problems. It estimates body fat based on height and weight. Your health care provider can help determine your BMI and help you achieve or maintain a healthy weight. Get regular exercise Get regular exercise. This is one of the most important things you can do for your health. Most adults should: Exercise for at least 150 minutes each week. The exercise should increase your heart rate and make you sweat (moderate-intensity exercise). Do strengthening exercises at least twice a week. This is in addition to the moderate-intensity exercise. Spend less time sitting. Even light physical activity can be beneficial. Watch cholesterol and blood lipids Have your blood tested for lipids and cholesterol at 33 years of age, then have this test every 5 years. Have your cholesterol levels checked more often if: Your lipid or cholesterol levels are high. You are older than 33 years of age. You are at high risk for heart disease. What should I know about cancer screening? Depending on your health history and family history, you may need to have cancer screening at various ages. This may  include screening for: Breast cancer. Cervical cancer. Colorectal cancer. Skin cancer. Lung cancer. What should I know about heart disease, diabetes, and high blood pressure? Blood pressure and heart disease High blood pressure causes heart disease and increases the risk of stroke. This is more likely to develop in people who have high blood pressure readings or are overweight. Have your blood pressure checked: Every 3-5 years if you are 9-50 years of age. Every year if you are 2 years old or older. Diabetes Have regular diabetes screenings. This checks your fasting blood sugar level. Have the screening done: Once every three years after age 24 if you are at a normal weight and have a low risk for diabetes. More often and at a younger age if you are overweight or have a high risk for diabetes. What should I know about preventing infection? Hepatitis B If you have a higher risk for hepatitis B, you should be screened for this virus. Talk with your health care provider to find out if you are at risk for hepatitis B infection. Hepatitis C Testing is recommended for: Everyone born from [birth year] through 1967. Anyone with known risk factors for hepatitis C. Sexually transmitted infections (STIs) Get screened for STIs, including gonorrhea and chlamydia, if: You are sexually active and are younger than 33 years of age. You are older than 33 years of age and your health care provider tells you that you are at risk for this type of infection. Your sexual activity has changed since you were last screened, and you are at increased  risk for chlamydia or gonorrhea. Ask your health care provider if you are at risk. Ask your health care provider about whether you are at high risk for HIV. Your health care provider may recommend a prescription medicine to help prevent HIV infection. If you choose to take medicine to prevent HIV, you should first get tested for HIV. You should then be tested every 3 months  for as long as you are taking the medicine. Pregnancy If you are about to stop having your period (premenopausal) and you may become pregnant, seek counseling before you get pregnant. Take 400 to 800 micrograms (mcg) of folic acid every day if you become pregnant. Ask for birth control (contraception) if you want to prevent pregnancy. Osteoporosis and menopause Osteoporosis is a disease in which the bones lose minerals and strength with aging. This can result in bone fractures. If you are 69 years old or older, or if you are at risk for osteoporosis and fractures, ask your health care provider if you should: Be screened for bone loss. Take a calcium or vitamin D supplement to lower your risk of fractures. Be given hormone replacement therapy (HRT) to treat symptoms of menopause. Follow these instructions at home: Alcohol use Do not drink alcohol if: Your health care provider tells you not to drink. You are pregnant, may be pregnant, or are planning to become pregnant. If you drink alcohol: Limit how much you have to: 0-1 drink a day. Know how much alcohol is in your drink. In the [place] one drink equals one 12 oz bottle of beer (355 mL), one 5 oz glass of wine (148 mL), or one 1 oz glass of hard liquor (44 mL). Lifestyle Do not use any products that contain nicotine or tobacco. These products include cigarettes, chewing tobacco, and vaping devices, such as e-cigarettes. If you need help quitting, ask your health care provider. Do not use street drugs. Do not share needles. Ask your health care provider for help if you need support or information about quitting drugs. General instructions Schedule regular health, dental, and eye exams. Stay current with your vaccines. Tell your health care provider if: You often feel depressed. You have ever been abused or do not feel safe at home. Summary Adopting a healthy lifestyle and getting preventive care are important in promoting health and  wellness. Follow your health care provider's instructions about healthy diet, exercising, and getting tested or screened for diseases. Follow your health care provider's instructions on monitoring your cholesterol and blood pressure. This information is not intended to replace advice given to you by your health care provider. Make sure you discuss any questions you have with your health care provider. Document Revised: 09/15/2020 Document Reviewed: 09/15/2020 Elsevier Patient Education  [place].

## 2021-12-08 NOTE — Progress Notes (Unsigned)
Subjective:    Patient ID: Lacey Sloan, female    DOB: 1990-06-18, 31 y.o.   MRN: [id number]      HPI Lacey is here for a Physical exam.    Gave birth to 3rd son in June.  She is starting to work on her weight loss.  She would like to discuss getting help with this.    Medications and allergies reviewed with patient and updated if appropriate.  Current Outpatient Medications on File Prior to Visit  Medication Sig Dispense Refill   acetaminophen (TYLENOL) 325 MG tablet Take 2 tablets (650 mg total) by mouth every 4 (four) hours as needed (for pain scale < 4). 30 tablet 1   sertraline (ZOLOFT) 100 MG tablet Take 1 tablet (100 mg total) by mouth daily. 90 tablet 1   No current facility-administered medications on file prior to visit.    Review of Systems  Constitutional:  Negative for fever.  Eyes:  Negative for visual disturbance.  Respiratory:  Negative for cough, shortness of breath and wheezing.   Cardiovascular:  Negative for chest pain, palpitations and leg swelling.  Gastrointestinal:  Negative for abdominal pain, blood in stool, constipation, diarrhea and nausea.       No gerd  Genitourinary:  Negative for dysuria.  Musculoskeletal:  Positive for arthralgias (knee pain) and back pain (lower back  - chronic).  Skin:  Negative for rash.  Neurological:  Positive for dizziness (mild - ? from skipping sertraline once). Negative for light-headedness and headaches.  Psychiatric/Behavioral:  Positive for dysphoric mood. The patient is nervous/anxious.        Objective:   Vitals:   12/09/21 1419  BP: 110/80  Pulse: 78  Temp: 98.3 F (36.8 C)  SpO2: 98%   Filed Weights   12/09/21 1419  Weight: 295 lb (133.8 kg)   Body mass index is 49.09 kg/m.  BP Readings from Last 3 Encounters:  12/09/21 110/80  10/11/21 (!) 105/56  06/02/21 122/61    Wt Readings from Last 3 Encounters:  12/09/21 295 lb (133.8 kg)  10/09/21 (!) 328 lb 1.6 oz (148.8 kg)  11/03/20  (!) 317 lb (143.8 kg)       Physical Exam Constitutional: She appears well-developed and well-nourished. No distress.  HENT:  Head: Normocephalic and atraumatic.  Right Ear: External ear normal. Normal ear canal and TM Left Ear: External ear normal.  Normal ear canal and TM Mouth/Throat: Oropharynx is clear and moist.  Eyes: Conjunctivae normal.  Neck: Neck supple. No tracheal deviation present. No thyromegaly present.  No carotid bruit  Cardiovascular: Normal rate, regular rhythm and normal heart sounds.   No murmur heard.  No edema. Pulmonary/Chest: Effort normal and breath sounds normal. No respiratory distress. She has no wheezes. She has no rales.  Breast: deferred   Abdominal: Soft. She exhibits no distension. There is no tenderness.  Lymphadenopathy: She has no cervical adenopathy.  Skin: Skin is warm and dry. She is not diaphoretic.  Psychiatric: She has a normal mood and affect. Her behavior is normal.     Lab Results  Component Value Date   WBC 21.4 (H) 10/11/2021   HGB 10.3 (L) 10/11/2021   HCT 31.0 (L) 10/11/2021   PLT 232 10/11/2021   GLUCOSE 81 10/17/2018   CHOL 186 10/17/2018   TRIG 101.0 10/17/2018   HDL 48.00 10/17/2018   LDLCALC 118 (H) 10/17/2018   ALT 15 10/17/2018   AST 14 10/17/2018   NA 138 10/17/2018  K 4.9 10/17/2018   CL 103 10/17/2018   CREATININE 0.79 10/17/2018   BUN 11 10/17/2018   CO2 27 10/17/2018   TSH 1.54 10/17/2018         Assessment & Plan:   Physical exam: Screening blood work  deferred Exercise  not regular - trying to be active Weight  working on weight loss - gave birth two months ago Substance abuse  none   Reviewed recommended immunizations.   Health Maintenance  Topic Date Due   COVID-19 Vaccine (1) 12/25/2021 (Originally 06/11/1991)   INFLUENZA VACCINE  08/08/2022 (Originally 12/08/2021)   PAP SMEAR-Modifier  03/19/2025   TETANUS/TDAP  07/29/2031   Hepatitis C Screening  Completed   HIV Screening   Completed   HPV VACCINES  Aged Out          See Problem List for Assessment and Plan of chronic medical problems.

## 2021-12-09 ENCOUNTER — Telehealth: Payer: Self-pay | Admitting: *Deleted

## 2021-12-09 ENCOUNTER — Ambulatory Visit (INDEPENDENT_AMBULATORY_CARE_PROVIDER_SITE_OTHER): Payer: 59 | Admitting: Internal Medicine

## 2021-12-09 ENCOUNTER — Other Ambulatory Visit: Payer: Self-pay | Admitting: Internal Medicine

## 2021-12-09 VITALS — BP 110/80 | HR 78 | Temp 98.3°F | Ht 65.0 in | Wt 295.0 lb

## 2021-12-09 DIAGNOSIS — Z0000 Encounter for general adult medical examination without abnormal findings: Secondary | ICD-10-CM

## 2021-12-09 DIAGNOSIS — F419 Anxiety disorder, unspecified: Secondary | ICD-10-CM | POA: Diagnosis not present

## 2021-12-09 DIAGNOSIS — F32A Depression, unspecified: Secondary | ICD-10-CM

## 2021-12-09 DIAGNOSIS — O09899 Supervision of other high risk pregnancies, unspecified trimester: Secondary | ICD-10-CM | POA: Insufficient documentation

## 2021-12-09 MED ORDER — SEMAGLUTIDE-WEIGHT MANAGEMENT 0.25 MG/0.5ML ~~LOC~~ SOAJ: 0.2500 mg | SUBCUTANEOUS | 0 refills | Status: DC

## 2021-12-09 NOTE — Telephone Encounter (Signed)
Pt was on cover-my-meds.. Need PA on Wegovy. Submitted PA w/ (Key: [id number]) Your information has been submitted to [employer].Marland KitchenRaechel Chute

## 2021-12-09 NOTE — Telephone Encounter (Signed)
Rec'd fax determination med was APPROVED. Effective 12/09/21 through 07/07/22. Faxing approval to [employer].Marland KitchenRaechel Chute

## 2021-12-09 NOTE — Assessment & Plan Note (Addendum)
Chronic BMI today 49.09 Wants to lose weight and starting to eat healthier and will start exercising when able Knows she needs to decrease her portions Discussed the importance of a diet high in protein and vegetables and low in sugars and carbs-overall decrease calorie intake She did look into a couple of weight loss medications Reviewed different options of weight loss medications, possible side effects, mechanism of action and effectiveness We will try if we can get Wegovy covered-start 0.25 mg weekly for 4 weeks and then increase if tolerated.  She understands side effects If this is not covered we can consider Saxenda and if that is not covered Qsymia

## 2021-12-09 NOTE — Assessment & Plan Note (Signed)
Chronic Controlled, stable Continue  sertraline 100 mg daily 

## 2021-12-11 ENCOUNTER — Other Ambulatory Visit: Payer: Self-pay | Admitting: Internal Medicine

## 2021-12-13 ENCOUNTER — Other Ambulatory Visit: Payer: Self-pay | Admitting: Internal Medicine

## 2021-12-15 ENCOUNTER — Encounter: Payer: Self-pay | Admitting: Internal Medicine

## 2021-12-16 MED ORDER — INSULIN PEN NEEDLE 32G X 6 MM MISC: 5 refills | Status: DC

## 2021-12-16 MED ORDER — SAXENDA 18 MG/3ML ~~LOC~~ SOPN: PEN_INJECTOR | SUBCUTANEOUS | 2 refills | Status: DC

## 2021-12-28 ENCOUNTER — Telehealth: Payer: Self-pay

## 2021-12-28 NOTE — Telephone Encounter (Signed)
Lacey Sloan (Key: [id number])

## 2021-12-28 NOTE — Telephone Encounter (Signed)
Lacey Sloan (Key: [id number])  This request has received an approval. View the bottom of the request for an electronic copy of the approval letter.

## 2021-12-31 ENCOUNTER — Other Ambulatory Visit: Payer: Self-pay | Admitting: Internal Medicine

## 2022-01-29 ENCOUNTER — Encounter: Payer: Self-pay | Admitting: Internal Medicine

## 2022-02-04 MED ORDER — PHENTERMINE-TOPIRAMATE ER 3.75-23 MG PO CP24: ORAL_CAPSULE | ORAL | 0 refills | Status: DC

## 2022-02-04 NOTE — Addendum Note (Signed)
Addended by: Binnie Rail on: 02/04/2022 08:43 PM   Modules accepted: Orders

## 2022-03-12 ENCOUNTER — Ambulatory Visit: Payer: 59 | Admitting: Internal Medicine

## 2022-06-03 ENCOUNTER — Encounter: Payer: Self-pay | Admitting: Internal Medicine

## 2022-06-03 MED ORDER — PHENTERMINE-TOPIRAMATE ER 3.75-23 MG PO CP24: ORAL_CAPSULE | ORAL | 0 refills | Status: DC

## 2022-06-09 NOTE — Telephone Encounter (Signed)
Patient Advocate Encounter   Received notification from [facility] that prior authorization for Qysmia 3.75-23mg  er is required.   PA submitted on 06/09/2022 Key [id number] Status is pending

## 2022-06-10 MED ORDER — QSYMIA 7.5-46 MG PO CP24: 1.0000 | ORAL_CAPSULE | Freq: Every day | ORAL | 0 refills | Status: DC

## 2022-06-10 NOTE — Telephone Encounter (Signed)
Pharmacy Patient Advocate Encounter  Received notification from [facility] that the request for prior authorization for Qsymia has been denied due to .    Please be advised we currently do not have a Pharmacist to review denials, therefore you will need to process appeals accordingly as needed. Thanks for your support at this time.   You may fax [phone], to appeal.

## 2022-06-13 ENCOUNTER — Other Ambulatory Visit: Payer: Self-pay | Admitting: Internal Medicine

## 2022-06-14 ENCOUNTER — Other Ambulatory Visit: Payer: Self-pay

## 2022-07-12 NOTE — Progress Notes (Unsigned)
      Subjective:    Patient ID: Lacey Sloan, female    DOB: 1991-03-13, 32 y.o.   MRN: [id number]     HPI Lacey is here for follow up of her chronic medical problems, including obesity  Too Qsymia 3.75-23 mg daily x 1 month Now taking Qsymia 7.5-46 mg daily    Medications and allergies reviewed with patient and updated if appropriate.  Current Outpatient Medications on File Prior to Visit  Medication Sig Dispense Refill   sertraline (ZOLOFT) 100 MG tablet TAKE 1 TABLET BY MOUTH EVERY DAY 90 tablet 1   acetaminophen (TYLENOL) 325 MG tablet Take 2 tablets (650 mg total) by mouth every 4 (four) hours as needed (for pain scale < 4). 30 tablet 1   Insulin Pen Needle 32G X 6 MM MISC UAD for saxenda injections 30 each 5   Phentermine-Topiramate (QSYMIA) 7.5-46 MG CP24 Take 1 capsule by mouth daily. 30 capsule 0   No current facility-administered medications on file prior to visit.     Review of Systems     Objective:  There were no vitals filed for this visit. BP Readings from Last 3 Encounters:  12/09/21 110/80  10/11/21 (!) 105/56  06/02/21 122/61   Wt Readings from Last 3 Encounters:  12/09/21 295 lb (133.8 kg)  10/09/21 (!) 328 lb 1.6 oz (148.8 kg)  11/03/20 (!) 317 lb (143.8 kg)   There is no height or weight on file to calculate BMI.    Physical Exam     Lab Results  Component Value Date   WBC 21.4 (H) 10/11/2021   HGB 10.3 (L) 10/11/2021   HCT 31.0 (L) 10/11/2021   PLT 232 10/11/2021   GLUCOSE 81 10/17/2018   CHOL 186 10/17/2018   TRIG 101.0 10/17/2018   HDL 48.00 10/17/2018   LDLCALC 118 (H) 10/17/2018   ALT 15 10/17/2018   AST 14 10/17/2018   NA 138 10/17/2018   K 4.9 10/17/2018   CL 103 10/17/2018   CREATININE 0.79 10/17/2018   BUN 11 10/17/2018   CO2 27 10/17/2018   TSH 1.54 10/17/2018     Assessment & Plan:    See Problem List for Assessment and Plan of chronic medical problems.

## 2022-07-13 ENCOUNTER — Ambulatory Visit: Payer: 59 | Admitting: Internal Medicine

## 2022-07-13 ENCOUNTER — Encounter: Payer: Self-pay | Admitting: Internal Medicine

## 2022-07-13 MED ORDER — QSYMIA 11.25-69 MG PO CP24: 1.0000 | ORAL_CAPSULE | Freq: Every day | ORAL | 0 refills | Status: DC

## 2022-07-13 NOTE — Patient Instructions (Addendum)
     Medications changes include :   increased dose of qsymia   Return in about 3 months (around 10/13/2022) for follow up.

## 2022-07-13 NOTE — Assessment & Plan Note (Signed)
Chronic Starting weight at home prior to starting Qsymia was 302 pounds Today at our scale she weighs 290 She is lost 10-12 pounds in just less than a month No side effects from the Qsymia.  Notes some increase in energy.  Still feels hungry a lot Stressed regular exercise, healthy eating, decrease portions Will continue Qsymia-increase dose to 11.5-69 mg daily x 2 weeks Then increase Qsymia to 15-92 mg daily Follow-up in 3 months

## 2022-07-26 ENCOUNTER — Other Ambulatory Visit: Payer: Self-pay | Admitting: Internal Medicine

## 2022-07-27 MED ORDER — QSYMIA 15-92 MG PO CP24: 1.0000 | ORAL_CAPSULE | Freq: Every day | ORAL | 5 refills | Status: DC

## 2022-10-12 ENCOUNTER — Ambulatory Visit: Payer: 59 | Admitting: Internal Medicine

## 2022-10-21 ENCOUNTER — Telehealth: Payer: Self-pay

## 2022-10-22 ENCOUNTER — Telehealth: Payer: Self-pay

## 2022-10-22 ENCOUNTER — Other Ambulatory Visit (HOSPITAL_COMMUNITY): Payer: Self-pay

## 2022-10-22 NOTE — Telephone Encounter (Signed)
PA submitted via CMM. Created new encounter for PA. Will route back to pool once determination has been made. 

## 2022-10-22 NOTE — Telephone Encounter (Signed)
Patient Advocate Encounter  Prior Authorization for Qsymia 15-92MG  er capsules has been approved with [employer].    PA# [id number] Effective dates: 10/22/22 through 10/22/23  Per [facility] test claim, copay for 30 days supply is $60.48

## 2022-10-22 NOTE — Telephone Encounter (Signed)
Pharmacy Patient Advocate Encounter   Received notification from [facility] that prior authorization for Qsymia 15-92MG  er capsules is required/requested.   PA submitted to [facility] via CoverMyMeds Key or (Medicaid) confirmation # [id number] Status is pending

## 2023-04-19 NOTE — Patient Instructions (Addendum)
Blood work was ordered.       Medications changes include :   wegovy    A referral was ordered sports medicine downstairs for your right achilles tendinitis and someone will call you to schedule an appointment.       Health Maintenance, Female Adopting a healthy lifestyle and getting preventive care are important in promoting health and wellness. Ask your health care provider about: The right schedule for you to have regular tests and exams. Things you can do on your own to prevent diseases and keep yourself healthy. What should I know about diet, weight, and exercise? Eat a healthy diet  Eat a diet that includes plenty of vegetables, fruits, low-fat dairy products, and lean protein. Do not eat a lot of foods that are high in solid fats, added sugars, or sodium. Maintain a healthy weight Body mass index (BMI) is used to identify weight problems. It estimates body fat based on height and weight. Your health care provider can help determine your BMI and help you achieve or maintain a healthy weight. Get regular exercise Get regular exercise. This is one of the most important things you can do for your health. Most adults should: Exercise for at least 150 minutes each week. The exercise should increase your heart rate and make you sweat (moderate-intensity exercise). Do strengthening exercises at least twice a week. This is in addition to the moderate-intensity exercise. Spend less time sitting. Even light physical activity can be beneficial. Watch cholesterol and blood lipids Have your blood tested for lipids and cholesterol at 34 years of age, then have this test every 5 years. Have your cholesterol levels checked more often if: Your lipid or cholesterol levels are high. You are older than 34 years of age. You are at high risk for heart disease. What should I know about cancer screening? Depending on your health history and family history, you may need to have cancer  screening at various ages. This may include screening for: Breast cancer. Cervical cancer. Colorectal cancer. Skin cancer. Lung cancer. What should I know about heart disease, diabetes, and high blood pressure? Blood pressure and heart disease High blood pressure causes heart disease and increases the risk of stroke. This is more likely to develop in people who have high blood pressure readings or are overweight. Have your blood pressure checked: Every 3-5 years if you are 24-27 years of age. Every year if you are 54 years old or older. Diabetes Have regular diabetes screenings. This checks your fasting blood sugar level. Have the screening done: Once every three years after age 24 if you are at a normal weight and have a low risk for diabetes. More often and at a younger age if you are overweight or have a high risk for diabetes. What should I know about preventing infection? Hepatitis B If you have a higher risk for hepatitis B, you should be screened for this virus. Talk with your health care provider to find out if you are at risk for hepatitis B infection. Hepatitis C Testing is recommended for: Everyone born from [birth year] through 1967. Anyone with known risk factors for hepatitis C. Sexually transmitted infections (STIs) Get screened for STIs, including gonorrhea and chlamydia, if: You are sexually active and are younger than 34 years of age. You are older than 34 years of age and your health care provider tells you that you are at risk for this type of infection. Your sexual activity has changed since  you were last screened, and you are at increased risk for chlamydia or gonorrhea. Ask your health care provider if you are at risk. Ask your health care provider about whether you are at high risk for HIV. Your health care provider may recommend a prescription medicine to help prevent HIV infection. If you choose to take medicine to prevent HIV, you should first get tested for HIV. You  should then be tested every 3 months for as long as you are taking the medicine. Pregnancy If you are about to stop having your period (premenopausal) and you may become pregnant, seek counseling before you get pregnant. Take 400 to 800 micrograms (mcg) of folic acid every day if you become pregnant. Ask for birth control (contraception) if you want to prevent pregnancy. Osteoporosis and menopause Osteoporosis is a disease in which the bones lose minerals and strength with aging. This can result in bone fractures. If you are 47 years old or older, or if you are at risk for osteoporosis and fractures, ask your health care provider if you should: Be screened for bone loss. Take a calcium or vitamin D supplement to lower your risk of fractures. Be given hormone replacement therapy (HRT) to treat symptoms of menopause. Follow these instructions at home: Alcohol use Do not drink alcohol if: Your health care provider tells you not to drink. You are pregnant, may be pregnant, or are planning to become pregnant. If you drink alcohol: Limit how much you have to: 0-1 drink a day. Know how much alcohol is in your drink. In the [place] one drink equals one 12 oz bottle of beer (355 mL), one 5 oz glass of wine (148 mL), or one 1 oz glass of hard liquor (44 mL). Lifestyle Do not use any products that contain nicotine or tobacco. These products include cigarettes, chewing tobacco, and vaping devices, such as e-cigarettes. If you need help quitting, ask your health care provider. Do not use street drugs. Do not share needles. Ask your health care provider for help if you need support or information about quitting drugs. General instructions Schedule regular health, dental, and eye exams. Stay current with your vaccines. Tell your health care provider if: You often feel depressed. You have ever been abused or do not feel safe at home. Summary Adopting a healthy lifestyle and getting preventive care are  important in promoting health and wellness. Follow your health care provider's instructions about healthy diet, exercising, and getting tested or screened for diseases. Follow your health care provider's instructions on monitoring your cholesterol and blood pressure. This information is not intended to replace advice given to you by your health care provider. Make sure you discuss any questions you have with your health care provider. Document Revised: 09/15/2020 Document Reviewed: 09/15/2020 Elsevier Patient Education  2024 [employer].

## 2023-04-19 NOTE — Progress Notes (Unsigned)
Subjective:    Patient ID: Lacey Sloan, female    DOB: 14-Nov-1990, 32 y.o.   MRN: [id number]      HPI Lacey is here for a Physical exam and her chronic medical problems.   Knot on right heel - tender - started 6 months ago - [place] and it popped up and has not gone away. Worse with actiivty.   Qsymia - stopped - not working.  She wonders about the GLP-1 inhibitors.   Medications and allergies reviewed with patient and updated if appropriate.  Current Outpatient Medications on File Prior to Visit  Medication Sig Dispense Refill   acetaminophen (TYLENOL) 325 MG tablet Take 2 tablets (650 mg total) by mouth every 4 (four) hours as needed (for pain scale < 4). 30 tablet 1   Insulin Pen Needle 32G X 6 MM MISC UAD for saxenda injections 30 each 5   levonorgestrel (MIRENA) 20 MCG/DAY IUD 1 each by Intrauterine route once.     sertraline (ZOLOFT) 100 MG tablet TAKE 1 TABLET BY MOUTH EVERY DAY 90 tablet 1   No current facility-administered medications on file prior to visit.    Review of Systems  Constitutional:  Negative for fever.  Eyes:  Negative for visual disturbance.  Respiratory:  Negative for cough, shortness of breath and wheezing.   Cardiovascular:  Negative for chest pain, palpitations and leg swelling.  Gastrointestinal:  Negative for abdominal pain, blood in stool, constipation and diarrhea.       Occ gerd  Genitourinary:  Negative for dysuria.  Musculoskeletal:  Negative for arthralgias and back pain (lower back pain).       Right ankle  Skin:  Negative for rash.  Neurological:  Positive for headaches (1-2 times a week - sinus or neck tension). Negative for light-headedness.  Psychiatric/Behavioral:  Positive for dysphoric mood (controlled). The patient is nervous/anxious (controlled).        Objective:   Vitals:   04/20/23 1529  BP: 116/72  Pulse: 78  Temp: 98.3 F (36.8 C)  SpO2: 99%   Filed Weights   04/20/23 1529  Weight: 290 lb  (131.5 kg)   Body mass index is 48.26 kg/m.  BP Readings from Last 3 Encounters:  04/20/23 116/72  07/13/22 106/82  12/09/21 110/80    Wt Readings from Last 3 Encounters:  04/20/23 290 lb (131.5 kg)  07/13/22 290 lb 3.2 oz (131.6 kg)  12/09/21 295 lb (133.8 kg)       Physical Exam Constitutional: She appears well-developed and well-nourished. No distress.  HENT:  Head: Normocephalic and atraumatic.  Right Ear: External ear normal. Normal ear canal and TM Left Ear: External ear normal.  Normal ear canal and TM Mouth/Throat: Oropharynx is clear and moist.  Eyes: Conjunctivae normal.  Neck: Neck supple. No tracheal deviation present. No thyromegaly present.  No carotid bruit  Cardiovascular: Normal rate, regular rhythm and normal heart sounds.   No murmur heard.  No edema. Pulmonary/Chest: Effort normal and breath sounds normal. No respiratory distress. She has no wheezes. She has no rales.  Breast: deferred   Abdominal: Soft. She exhibits no distension. There is no tenderness.  Lymphadenopathy: She has no cervical adenopathy. Musculoskeletal: Enlargement of the base of her right Achilles tendon which is tender-no overlying erythema or warmth.  No calf tenderness.  No heel tenderness Skin: Skin is warm and dry. She is not diaphoretic.  Psychiatric: She has a normal mood and affect. Her behavior is normal.  Lab Results  Component Value Date   WBC 21.4 (H) 10/11/2021   HGB 10.3 (L) 10/11/2021   HCT 31.0 (L) 10/11/2021   PLT 232 10/11/2021   GLUCOSE 81 10/17/2018   CHOL 186 10/17/2018   TRIG 101.0 10/17/2018   HDL 48.00 10/17/2018   LDLCALC 118 (H) 10/17/2018   ALT 15 10/17/2018   AST 14 10/17/2018   NA 138 10/17/2018   K 4.9 10/17/2018   CL 103 10/17/2018   CREATININE 0.79 10/17/2018   BUN 11 10/17/2018   CO2 27 10/17/2018   TSH 1.54 10/17/2018         Assessment & Plan:   Physical exam: Screening blood work  ordered Exercise  walking-stressed the  importance of regular exercise Weight  obese - discussed weight loss at length Substance abuse  none   Reviewed recommended immunizations.   Health Maintenance  Topic Date Due   Cervical Cancer Screening (HPV/Pap Cotest)  03/03/2021   COVID-19 Vaccine (1 - 2023-24 season) 05/06/2023 (Originally 01/09/2023)   DTaP/Tdap/Td (4 - Td or Tdap) 07/29/2031   INFLUENZA VACCINE  Completed   Hepatitis C Screening  Completed   HIV Screening  Completed   HPV VACCINES  Aged Out          See Problem List for Assessment and Plan of chronic medical problems.

## 2023-04-20 ENCOUNTER — Ambulatory Visit (INDEPENDENT_AMBULATORY_CARE_PROVIDER_SITE_OTHER): Payer: 59 | Admitting: Internal Medicine

## 2023-04-20 ENCOUNTER — Other Ambulatory Visit: Payer: Self-pay | Admitting: Internal Medicine

## 2023-04-20 VITALS — BP 116/72 | HR 78 | Temp 98.3°F | Ht 65.0 in | Wt 290.0 lb

## 2023-04-20 DIAGNOSIS — Z0000 Encounter for general adult medical examination without abnormal findings: Secondary | ICD-10-CM | POA: Diagnosis not present

## 2023-04-20 DIAGNOSIS — M7661 Achilles tendinitis, right leg: Secondary | ICD-10-CM | POA: Diagnosis not present

## 2023-04-20 DIAGNOSIS — F32A Depression, unspecified: Secondary | ICD-10-CM | POA: Diagnosis not present

## 2023-04-20 DIAGNOSIS — F419 Anxiety disorder, unspecified: Secondary | ICD-10-CM

## 2023-04-20 DIAGNOSIS — M6528 Calcific tendinitis, other site: Secondary | ICD-10-CM | POA: Insufficient documentation

## 2023-04-20 LAB — COMPREHENSIVE METABOLIC PANEL
ALT: 14 U/L (ref 0–35)
AST: 14 U/L (ref 0–37)
Albumin: 4.1 g/dL (ref 3.5–5.2)
Alkaline Phosphatase: 49 U/L (ref 39–117)
BUN: 14 mg/dL (ref 6–23)
CO2: 28 meq/L (ref 19–32)
Calcium: 9.1 mg/dL (ref 8.4–10.5)
Chloride: 105 meq/L (ref 96–112)
Creatinine, Ser: 0.7 mg/dL (ref 0.40–1.20)
GFR: 112.89 mL/min (ref >60.00)
Glucose, Bld: 75 mg/dL (ref 70–99)
Potassium: 4 meq/L (ref 3.5–5.1)
Sodium: 140 meq/L (ref 135–145)
Total Bilirubin: 0.6 mg/dL (ref 0.2–1.2)
Total Protein: 6.7 g/dL (ref 6.0–8.3)

## 2023-04-20 LAB — CBC WITH DIFFERENTIAL/PLATELET
Basophils Absolute: 0 10*3/uL (ref 0.0–0.1)
Basophils Relative: 0.5 % (ref 0.0–3.0)
Eosinophils Absolute: 0.1 10*3/uL (ref 0.0–0.7)
Eosinophils Relative: 1.4 % (ref 0.0–5.0)
HCT: 40 % (ref 36.0–46.0)
Hemoglobin: 13.4 g/dL (ref 12.0–15.0)
Lymphocytes Relative: 30.3 % (ref 12.0–46.0)
Lymphs Abs: 3 10*3/uL (ref 0.7–4.0)
MCHC: 33.5 g/dL (ref 30.0–36.0)
MCV: 90.8 fL (ref 78.0–100.0)
Monocytes Absolute: 0.5 10*3/uL (ref 0.1–1.0)
Monocytes Relative: 5.5 % (ref 3.0–12.0)
Neutro Abs: 6.2 10*3/uL (ref 1.4–7.7)
Neutrophils Relative %: 62.3 % (ref 43.0–77.0)
Platelets: 304 10*3/uL (ref 150.0–400.0)
RBC: 4.41 Mil/uL (ref 3.87–5.11)
RDW: 12.6 % (ref 11.5–15.5)
WBC: 9.9 10*3/uL (ref 4.0–10.5)

## 2023-04-20 LAB — TSH: TSH: 0.78 u[IU]/mL (ref 0.35–5.50)

## 2023-04-20 LAB — LIPID PANEL
Cholesterol: 180 mg/dL (ref 0–200)
HDL: 47.6 mg/dL (ref >39.00)
LDL Cholesterol: 114 mg/dL — ABNORMAL HIGH (ref 0–99)
NonHDL: 132.11
Total CHOL/HDL Ratio: 4
Triglycerides: 92 mg/dL (ref 0.0–149.0)
VLDL: 18.4 mg/dL (ref 0.0–40.0)

## 2023-04-20 LAB — HEMOGLOBIN A1C: Hgb A1c MFr Bld: 5.6 % (ref 4.6–6.5)

## 2023-04-20 MED ORDER — WEGOVY 0.25 MG/0.5ML ~~LOC~~ SOAJ: 0.2500 mg | SUBCUTANEOUS | 0 refills | Status: DC

## 2023-04-20 MED ORDER — SERTRALINE HCL 100 MG PO TABS: 100.0000 mg | ORAL_TABLET | Freq: Every day | ORAL | 1 refills | Status: DC

## 2023-04-20 NOTE — Assessment & Plan Note (Signed)
Chronic BMI is 48.26 kg/m With comorbidities of anxiety and depression Qsymia not effective Would benefit greatly from a GLP-1 Start Wegovy 0.25 mg weekly-will titrate as tolerated Discussed possible side effects including GI side effects, pancreatitis, vision issues, gastroparesis

## 2023-04-20 NOTE — Assessment & Plan Note (Signed)
Chronic.  Controlled.  Continue sertraline 100 mg daily

## 2023-04-20 NOTE — Assessment & Plan Note (Signed)
New Has pain and swelling in her right Achilles tendon x 6 months-started after walking around [place] [employer]-has not improved Has Achilles tendinitis Will refer to sports medicine for further treatment

## 2023-04-21 ENCOUNTER — Other Ambulatory Visit: Payer: Self-pay | Admitting: Internal Medicine

## 2023-04-21 MED ORDER — WEGOVY 0.25 MG/0.5ML ~~LOC~~ SOAJ: 0.2500 mg | SUBCUTANEOUS | 0 refills | Status: DC

## 2023-04-21 MED ORDER — SERTRALINE HCL 100 MG PO TABS: 100.0000 mg | ORAL_TABLET | Freq: Every day | ORAL | 1 refills | Status: DC

## 2023-04-21 MED ORDER — SERTRALINE HCL 100 MG PO TABS: 100.0000 mg | ORAL_TABLET | Freq: Every day | ORAL | 1 refills | Status: AC

## 2023-04-26 ENCOUNTER — Telehealth: Payer: Self-pay | Admitting: Pharmacist

## 2023-04-26 NOTE — Telephone Encounter (Signed)
Pharmacy Patient Advocate Encounter   Received notification from CoverMyMeds that prior authorization for [facility] 0.25MG /0.5ML auto-injectors is required/requested.   Insurance verification completed.   The patient is insured through [facility] .   Per test claim: PA required; PA submitted to above mentioned insurance via CoverMyMeds Key/confirmation #/EOC [id number] Status is pending

## 2023-04-27 ENCOUNTER — Other Ambulatory Visit (HOSPITAL_COMMUNITY): Payer: Self-pay

## 2023-04-27 NOTE — Telephone Encounter (Signed)
Pharmacy Patient Advocate Encounter  Received notification from [facility] that Prior Authorization for Wegovy 0.25MG /0.5ML auto-injectors has been APPROVED from 04/26/2023 to 11/24/2023. Ran test claim, Copay is $24.99. This test claim was processed through [facility]- copay amounts may vary at other pharmacies due to pharmacy/plan contracts, or as the patient moves through the different stages of their insurance plan.   PA #/Case ID/Reference #: [id number]

## 2023-04-27 NOTE — Telephone Encounter (Signed)
Patient notified via my-chart regarding approval.  

## 2023-05-21 ENCOUNTER — Other Ambulatory Visit: Payer: Self-pay | Admitting: Internal Medicine

## 2023-05-23 MED ORDER — WEGOVY 0.25 MG/0.5ML ~~LOC~~ SOAJ: 0.2500 mg | SUBCUTANEOUS | 0 refills | Status: DC

## 2023-05-25 ENCOUNTER — Other Ambulatory Visit: Payer: Self-pay | Admitting: Internal Medicine

## 2023-06-21 ENCOUNTER — Other Ambulatory Visit (HOSPITAL_COMMUNITY): Payer: Self-pay

## 2023-06-21 ENCOUNTER — Telehealth: Payer: Self-pay

## 2023-06-21 NOTE — Telephone Encounter (Signed)
Pharmacy Patient Advocate Encounter   Received notification from CoverMyMeds that prior authorization for [facility] 0.25MG /0.5ML auto-injectors is required/requested.   Insurance verification completed.   The patient is insured through [facility] .   Per test claim: PA required; PA submitted to above mentioned insurance via CoverMyMeds Key/confirmation #/EOC [id number] Status is pending

## 2023-06-21 NOTE — Telephone Encounter (Signed)
Pharmacy Patient Advocate Encounter  Received notification from [facility] that Prior Authorization for [facility] 0.25MG /0.5ML auto-injectors has been APPROVED from 06/21/2023 to 01/19/2024   PA #/Case ID/Reference #: Pollyann Glen

## 2023-07-13 ENCOUNTER — Other Ambulatory Visit: Payer: Self-pay

## 2023-07-13 ENCOUNTER — Ambulatory Visit: Payer: 59 | Admitting: Family Medicine

## 2023-07-13 VITALS — BP 118/76 | HR 77 | Ht 65.0 in | Wt 290.0 lb

## 2023-07-13 DIAGNOSIS — M6528 Calcific tendinitis, other site: Secondary | ICD-10-CM | POA: Diagnosis not present

## 2023-07-13 DIAGNOSIS — G8929 Other chronic pain: Secondary | ICD-10-CM | POA: Diagnosis not present

## 2023-07-13 DIAGNOSIS — M79671 Pain in right foot: Secondary | ICD-10-CM

## 2023-07-13 NOTE — Progress Notes (Signed)
   Rubin Payor, PhD, LAT, ATC acting as a scribe for [name], MD.  Lacey Sloan is a 33 y.o. female who presents to [facility] at [facility] today for R heel pain x 6 months. Pain started when she was walking around [employer]. Pt locates pain to the posterior aspect of her R heel w/ visible nodule.   Aggravates: increased walking,  Treatments tried: changing shoes, lidocaine gel, elevation  Pertinent review of systems: No fever or chills  Relevant historical information: History of migraine headaches.   Exam:  BP 118/76   Pulse 77   Ht 5\' 5"  (1.651 m)   Wt 290 lb (131.5 kg)   SpO2 98%   BMI 48.26 kg/m  General: Well Developed, well nourished, and in no acute distress.   MSK: Right heel visible nodule posterior calcaneus normal-appearing otherwise.  Normal foot and ankle motion.  Tender palpation posterior calcaneus. Intact strength.    Lab and Radiology Results  \Diagnostic Limited MSK Ultrasound of: Right Achilles tendon Achilles tendon is intact at insertion however there is calcific change at distal tendon insertion within the tendon and the tendon is increased thickness. Impression: Chronic calcific Achilles tendinitis      Assessment and Plan: 33 y.o. female with right heel pain due to chronic calcific Achilles tendinitis.  She has not tried much treatment yet.  Plan for eccentric exercises taught in clinic today prior to discharge and Voltaren gel.  Recommend also heel lift.  Recheck in a month.  Consider adding nitroglycerin patch protocol if needed.  After that if needed we could use shockwave.   PDMP not reviewed this encounter. Orders Placed This Encounter  Procedures   [place] LIMITED JOINT SPACE STRUCTURES LOW RIGHT(NO LINKED CHARGES)    Reason for Exam (SYMPTOM  OR DIAGNOSIS REQUIRED):   right heel pain    Preferred imaging location?:   [facility]   No orders of the defined types were placed in this  encounter.    Discussed warning signs or symptoms. Please see discharge instructions. Patient expresses understanding.   The above documentation has been reviewed and is accurate and complete [name], M.D.

## 2023-07-13 NOTE — Patient Instructions (Addendum)
 Thank you for coming in today.   Please work on the home exercises the athletic trainer went over with you:  Work on the eccentric heel raises. 3 sets, of 10 reps  Please use Voltaren gel (Generic Diclofenac Gel) up to 4x daily for pain as needed.  This is available over-the-counter as both the name brand Voltaren gel and the generic diclofenac gel.   Check back in 1 month.

## 2023-08-17 ENCOUNTER — Ambulatory Visit: Admitting: Family Medicine

## 2023-08-18 ENCOUNTER — Ambulatory Visit: Admitting: Family Medicine

## 2023-08-18 VITALS — BP 108/76 | HR 73 | Ht 65.0 in | Wt 295.0 lb

## 2023-08-18 DIAGNOSIS — M7661 Achilles tendinitis, right leg: Secondary | ICD-10-CM

## 2023-08-18 MED ORDER — NITROGLYCERIN 0.2 MG/HR TD PT24
MEDICATED_PATCH | TRANSDERMAL | 1 refills | Status: AC
Start: 2023-08-18 — End: ?

## 2023-08-18 MED ORDER — NITROGLYCERIN 0.2 MG/HR TD PT24: MEDICATED_PATCH | TRANSDERMAL | 1 refills | Status: DC

## 2023-08-18 MED ORDER — NITROGLYCERIN 0.2 MG/HR TD PT24
0.2000 mg | MEDICATED_PATCH | Freq: Every day | TRANSDERMAL | 1 refills | Status: DC
Start: 2023-08-18 — End: 2023-08-18

## 2023-08-18 NOTE — Progress Notes (Unsigned)
   Rubin Payor, PhD, LAT, ATC acting as a scribe for Lacey Graham, MD.  Lacey Sloan is a 33 y.o. female who presents to [facility] at [facility] today for 42-month f/u R calcific Achilles tendinitis. Pt was last seen by Dr. Denyse Amass on 07/13/23 and was advised to use Voltaren gel, a heel life, and was taught HEP.  Today, pt reports R heel is hurting more. She has been working on [employer]. Pain is still located at the posterior aspect of the R calcaneous.   Pertinent review of systems: No fevers or chills  Relevant historical information: Migraine headaches.  Calcific tendinitis right Achilles tendon.   Exam:  BP 108/76   Pulse 73   Ht 5\' 5"  (1.651 m)   Wt 295 lb (133.8 kg)   SpO2 98%   BMI 49.09 kg/m  General: Well Developed, well nourished, and in no acute distress.   MSK: Right posterior calcaneus some swelling present near distal tendon insertion.  Normal motion.  Intact strength.    Lab and Radiology Results                  Extracorporeal Shockwave Therapy Note    Patient is being treated today with ECSWT. Informed consent was obtained and patient tolerated procedure well.   Therapy performed by Lacey Sloan  Condition treated: Right calcific Achilles tendinitis Treatment preset used: Achilles tendinitis Energy used: 100 mJ Frequency used: 15 Hz Number of pulses: 2000 Head Size: Medium Treatment #1 of #4      Assessment and Plan: 33 y.o. female with chronic calcific tendinitis right Achilles tendon.  Plan to initiate shockwave today.  No charge for today shockwave.  Will also start nitroglycerin patch protocol.  This is a bit concerning.  She does have a migraine history and nitroglycerin can cause migraines.  If it is too annoying she can just stop it. As for shockwave she will see how it goes today.  If she felt that was helpful she can schedule for more.  Typical course would be 4.   PDMP not reviewed this encounter. No orders of the defined  types were placed in this encounter.  Meds ordered this encounter  Medications   DISCONTD: nitroGLYCERIN (NITRODUR - DOSED IN MG/24 HR) 0.2 mg/hr patch    Sig: Place 1 patch (0.2 mg total) onto the skin daily. Place a quarter of the patch over the affected area.    Dispense:  30 patch    Refill:  1   DISCONTD: nitroGLYCERIN (NITRODUR - DOSED IN MG/24 HR) 0.2 mg/hr patch    Sig: Cut and place a quarter of the patch over the affected area.    Dispense:  30 patch    Refill:  1   DISCONTD: nitroGLYCERIN (NITRODUR - DOSED IN MG/24 HR) 0.2 mg/hr patch    Sig: Cut and place a quarter of the patch over the affected area.    Dispense:  30 patch    Refill:  1   nitroGLYCERIN (NITRODUR - DOSED IN MG/24 HR) 0.2 mg/hr patch    Sig: Cut and place a quarter of the patch over the affected area.    Dispense:  30 patch    Refill:  1     Discussed warning signs or symptoms. Please see discharge instructions. Patient expresses understanding.   The above documentation has been reviewed and is accurate and complete Lacey Sloan, M.D.

## 2023-08-18 NOTE — Patient Instructions (Addendum)
 Thank you for coming in today.   Nitroglycerin Protocol Apply 1/4 nitroglycerin patch to affected area daily. Change position of patch within the affected area every 24 hours. You may experience a headache during the first 1-2 weeks of using the patch, these should subside. If you experience headaches after beginning nitroglycerin patch treatment, you may take your preferred over the counter pain reliever. Another side effect of the nitroglycerin patch is skin irritation or rash related to patch adhesive. Please notify our office if you develop more severe headaches or rash, and stop the patch. Tendon healing with nitroglycerin patch may require 12 to 24 weeks depending on the extent of injury. Men should not use if taking Viagra, Cialis, or Levitra.  Do not use if you have migraines or rosacea.    Let me know if you want to do more shockwave treatments  Check back in 1 month

## 2023-09-08 ENCOUNTER — Encounter: Payer: Self-pay | Admitting: Internal Medicine

## 2023-09-15 ENCOUNTER — Ambulatory Visit: Admitting: Family Medicine

## 2023-12-14 ENCOUNTER — Encounter: Payer: Self-pay | Admitting: Internal Medicine

## 2023-12-20 NOTE — Progress Notes (Signed)
 Virtual Visit via Video Note  I connected with Lacey Sloan on 12/20/23 at  2:20 PM EDT by a video enabled telemedicine application and verified that I am speaking with the correct person using two identifiers.   I discussed the limitations of evaluation and management by telemedicine and the availability of in person appointments. The patient expressed understanding and agreed to proceed.  Present for the visit:  Myself, Dr Glade Hope, Lacey Sabet.  The patient is currently at home and I am in the office.    No referring provider.    History of Present Illness: This is an acute visit for this patient of weight loss medications.  She was on Wegovy  and was doing well.  Insurance changed the cost of Wegovy  to $400 a month which she is not able to afford.  She was wondering about Xenical or Contrave.  Trying to exercise 2/week   Social History   Socioeconomic History   Marital status: Married    Spouse name: Not on file   Number of children: Not on file   Years of education: Not on file   Highest education level: Associate degree: academic program  Occupational History   Not on file  Tobacco Use   Smoking status: Former   Smokeless tobacco: Former    Quit date: 02/2020  Vaping Use   Vaping status: Former   Quit date: 03/08/2020  Substance and Sexual Activity   Alcohol use: Not Currently    Comment: socially    Drug use: Never   Sexual activity: Not on file  Other Topics Concern   Not on file  Social History Narrative   Married, 75 year old son   Works at [employer]   No regular exercise   Social Drivers of Corporate investment banker Strain: Low Risk  (12/18/2023)   Overall Financial Resource Strain (CARDIA)    Difficulty of Paying Living Expenses: Not hard at all  Food Insecurity: No Food Insecurity (12/18/2023)   Hunger Vital Sign    Worried About Running Out of Food in the Last Year: Never true    Ran Out of Food in the Last Year: Never true  Transportation  Needs: No Transportation Needs (12/18/2023)   PRAPARE - Administrator, Civil Service (Medical): No    Lack of Transportation (Non-Medical): No  Physical Activity: Insufficiently Active (12/18/2023)   Exercise Vital Sign    Days of Exercise per Week: 2 days    Minutes of Exercise per Session: 20 min  Stress: No Stress Concern Present (12/18/2023)   [employer] of Occupational Health - Occupational Stress Questionnaire    Feeling of Stress: Not at all  Social Connections: Socially Integrated (12/18/2023)   Social Connection and Isolation Panel    Frequency of Communication with Friends and Family: More than three times a week    Frequency of Social Gatherings with Friends and Family: More than three times a week    Attends Religious Services: More than 4 times per year    Active Member of [employer] or Organizations: Yes    Attends Engineer, structural: More than 4 times per year    Marital Status: Married     Observations/Objective: Appears well in NAD   Assessment and Plan:  See Problem List for Assessment and Plan of chronic medical problems.   Follow Up Instructions:    I discussed the assessment and treatment plan with the patient. The patient was provided an opportunity  to ask questions and all were answered. The patient agreed with the plan and demonstrated an understanding of the instructions.   The patient was advised to call back or seek an in-person evaluation if the symptoms worsen or if the condition fails to improve as anticipated.    Glade JINNY Hope, MD

## 2023-12-21 ENCOUNTER — Telehealth: Admitting: Internal Medicine

## 2023-12-21 ENCOUNTER — Encounter: Payer: Self-pay | Admitting: Internal Medicine

## 2023-12-21 DIAGNOSIS — F419 Anxiety disorder, unspecified: Secondary | ICD-10-CM

## 2023-12-21 DIAGNOSIS — F32A Depression, unspecified: Secondary | ICD-10-CM | POA: Diagnosis not present

## 2023-12-21 MED ORDER — SAXENDA 18 MG/3ML ~~LOC~~ SOPN
PEN_INJECTOR | SUBCUTANEOUS | 2 refills | Status: DC
Start: 2023-12-21 — End: 2023-12-21

## 2023-12-21 MED ORDER — SAXENDA 18 MG/3ML ~~LOC~~ SOPN: PEN_INJECTOR | SUBCUTANEOUS | 2 refills | Status: DC

## 2023-12-21 MED ORDER — INSULIN PEN NEEDLE 32G X 6 MM MISC: 5 refills | Status: DC

## 2023-12-21 MED ORDER — INSULIN PEN NEEDLE 32G X 6 MM MISC: 5 refills | Status: AC

## 2023-12-21 NOTE — Assessment & Plan Note (Signed)
 Chronic Was on Wegovy  and that was effective, but co-pay has gone to be too expensive and she can no longer afford this She wonders about alternative Zepbound will be the same cost Discussed with Xenical, Contrave, Qsymia , Saxenda  She is exercising consistently 2 days a week-stressed that she needs to increase her exercise if possible-try incorporating the kids to that it is easier Stressed increase protein, lots of vegetables, fiber Stressed that no matter what medication she is on the only thing that works long-term is lifestyle changes Trial of Saxenda  daily injections-0.6 mg x 1 week, 1.2 mg x 1 week, 1.8 mg x 1 week, 2.4 mg x 1 week and then 3 mg daily

## 2023-12-21 NOTE — Patient Instructions (Signed)
      Blood work was ordered.       Medications changes include :   None    A referral was ordered and someone will call you to schedule an appointment.     No follow-ups on file.

## 2023-12-21 NOTE — Assessment & Plan Note (Signed)
 Chronic Controlled Continue sertraline  100 mg daily

## 2023-12-27 ENCOUNTER — Other Ambulatory Visit: Payer: Self-pay

## 2023-12-27 MED ORDER — SAXENDA 18 MG/3ML ~~LOC~~ SOPN
PEN_INJECTOR | SUBCUTANEOUS | 2 refills | Status: DC
Start: 2023-12-27 — End: 2023-12-28

## 2023-12-27 MED ORDER — SAXENDA 18 MG/3ML ~~LOC~~ SOPN: PEN_INJECTOR | SUBCUTANEOUS | 2 refills | Status: DC

## 2023-12-28 ENCOUNTER — Telehealth: Payer: Self-pay

## 2023-12-28 ENCOUNTER — Other Ambulatory Visit (HOSPITAL_COMMUNITY): Payer: Self-pay

## 2023-12-28 ENCOUNTER — Other Ambulatory Visit: Payer: Self-pay

## 2023-12-28 MED ORDER — SAXENDA 18 MG/3ML ~~LOC~~ SOPN: PEN_INJECTOR | SUBCUTANEOUS | 2 refills | Status: AC

## 2023-12-28 NOTE — Telephone Encounter (Signed)
 Pharmacy Patient Advocate Encounter   Received notification from Patient Advice Request messages that prior authorization for saxenda  18mg /66ml is required/requested.   Insurance verification completed.   The patient is insured through [facility] .   Per test claim: PA required; PA submitted to above mentioned insurance via Latent Key/confirmation #/EOC [id number] Status is pending

## 2023-12-28 NOTE — Telephone Encounter (Signed)
 Pharmacy Patient Advocate Encounter  Received notification from [facility] that Prior Authorization for Saxenda  18mg /15ml has been APPROVED from 12/28/23 to 04/26/24   PA #/Case ID/Reference #: [id number]

## 2023-12-29 ENCOUNTER — Other Ambulatory Visit (HOSPITAL_COMMUNITY): Payer: Self-pay

## 2024-03-13 ENCOUNTER — Encounter: Payer: Self-pay | Admitting: Internal Medicine

## 2024-03-13 NOTE — Progress Notes (Unsigned)
    Subjective:    Patient ID: Lacey Sloan, female    DOB: 04-02-1991, 33 y.o.   MRN: [id number]      HPI Lacey Sloan is here for No chief complaint on file.        Medications and allergies reviewed with patient and updated if appropriate.  Current Outpatient Medications on File Prior to Visit  Medication Sig Dispense Refill   Insulin  Pen Needle 32G X 6 MM MISC UAD for saxenda  injections 30 each 5   levonorgestrel (MIRENA) 20 MCG/DAY IUD 1 each by Intrauterine route once.     Liraglutide  -Weight Management (SAXENDA ) 18 MG/3ML SOPN 0.6 mg [place] qd x1wk, then incr. dose by 0.6 mg/day qwk to target 3 mg [place] qd 3 mL 2   nitroGLYCERIN  (NITRODUR - DOSED IN MG/24 HR) 0.2 mg/hr patch Cut and place a quarter of the patch over the affected area. 30 patch 1   sertraline  (ZOLOFT ) 100 MG tablet Take 1 tablet (100 mg total) by mouth daily. 90 tablet 1   No current facility-administered medications on file prior to visit.    Review of Systems     Objective:  There were no vitals filed for this visit. BP Readings from Last 3 Encounters:  08/18/23 108/76  07/13/23 118/76  04/20/23 116/72   Wt Readings from Last 3 Encounters:  08/18/23 295 lb (133.8 kg)  07/13/23 290 lb (131.5 kg)  04/20/23 290 lb (131.5 kg)   There is no height or weight on file to calculate BMI.    Physical Exam         Assessment & Plan:    See Problem List for Assessment and Plan of chronic medical problems.

## 2024-03-14 ENCOUNTER — Ambulatory Visit (INDEPENDENT_AMBULATORY_CARE_PROVIDER_SITE_OTHER): Admitting: Internal Medicine

## 2024-03-14 VITALS — BP 110/70 | HR 78 | Temp 98.1°F | Ht 65.0 in | Wt 286.0 lb

## 2024-03-14 DIAGNOSIS — F32A Depression, unspecified: Secondary | ICD-10-CM

## 2024-03-14 DIAGNOSIS — F419 Anxiety disorder, unspecified: Secondary | ICD-10-CM | POA: Diagnosis not present

## 2024-03-14 DIAGNOSIS — R21 Rash and other nonspecific skin eruption: Secondary | ICD-10-CM | POA: Diagnosis not present

## 2024-03-14 MED ORDER — TRIAMCINOLONE ACETONIDE 0.1 % EX CREA: 1.0000 | TOPICAL_CREAM | Freq: Two times a day (BID) | CUTANEOUS | 2 refills | Status: DC

## 2024-03-14 NOTE — Assessment & Plan Note (Signed)
 Chronic BMI 47.5 Currently on Saxenda  3 mg daily and is doing well-has some reflux, but has lost 20 pounds Advised to make sure she is treating the reflux Continue Saxenda  3 mg daily Stressed regular exercise Stressed the diet high in protein, eating healthy, drinking plenty of water

## 2024-03-14 NOTE — Assessment & Plan Note (Signed)
 Acute Localized rash to left chest wall below axilla Present for 2 months-itchy and tender Lesions do resolve, but new ones come-has not spread to any other areas Does not seem allergic in nature ?  Cause Trial of triamcinolone cream twice daily as needed If your rash does not resolve or continues to recur advised her to see dermatology

## 2024-03-14 NOTE — Assessment & Plan Note (Signed)
 Chronic Has seen some more variability in her mood recently ?  Related to the Saxenda  Overall still doing okay and does not feel like she needs to make any changes Continue sertraline  100 mg daily

## 2024-03-14 NOTE — Patient Instructions (Addendum)
       Medications changes include :   triamcinolone cream for rash        Return in about 6 months (around 09/11/2024) for follow up.

## 2024-03-15 ENCOUNTER — Other Ambulatory Visit: Payer: Self-pay

## 2024-03-15 MED ORDER — TRIAMCINOLONE ACETONIDE 0.1 % EX CREA: 1.0000 | TOPICAL_CREAM | Freq: Two times a day (BID) | CUTANEOUS | 2 refills | Status: AC

## 2024-05-11 DIAGNOSIS — M25562 Pain in left knee: Secondary | ICD-10-CM | POA: Insufficient documentation

## 2024-05-11 DIAGNOSIS — M25561 Pain in right knee: Secondary | ICD-10-CM
